# Patient Record
Sex: Female | Born: 1985 | Hispanic: Yes | Marital: Single | Smoking: Never smoker
Health system: Southern US, Community
[De-identification: ages and names within clinical notes are randomized; demographics above are authoritative.]

## PROBLEM LIST (undated history)

## (undated) ENCOUNTER — Inpatient Hospital Stay (HOSPITAL_COMMUNITY): Payer: Self-pay

## (undated) DIAGNOSIS — K831 Obstruction of bile duct: Secondary | ICD-10-CM

## (undated) DIAGNOSIS — O26649 Intrahepatic cholestasis of pregnancy, unspecified trimester: Secondary | ICD-10-CM

## (undated) DIAGNOSIS — O26619 Liver and biliary tract disorders in pregnancy, unspecified trimester: Secondary | ICD-10-CM

## (undated) HISTORY — PX: NO PAST SURGERIES: SHX2092

---

## 2011-08-14 ENCOUNTER — Emergency Department (HOSPITAL_COMMUNITY)
Admission: EM | Admit: 2011-08-14 | Discharge: 2011-08-15 | Disposition: A | Discharge status: Home / Self Care | Payer: Self-pay | Attending: Emergency Medicine | Admitting: Emergency Medicine

## 2011-08-14 DIAGNOSIS — R109 Unspecified abdominal pain: Secondary | ICD-10-CM | POA: Insufficient documentation

## 2011-08-14 DIAGNOSIS — O99891 Other specified diseases and conditions complicating pregnancy: Secondary | ICD-10-CM | POA: Insufficient documentation

## 2011-08-15 LAB — HCG, QUANTITATIVE, PREGNANCY: hCG, Beta Chain, Quant, S: 12940 m[IU]/mL — ABNORMAL HIGH (ref <5)

## 2011-08-15 LAB — CBC
MCH: 30.2 pg (ref 26.0–34.0)
MCHC: 35.9 g/dL (ref 30.0–36.0)
MCV: 84.2 fL (ref 78.0–100.0)
Platelets: 232 10*3/uL (ref 150–400)

## 2011-08-15 LAB — DIFFERENTIAL
Basophils Relative: 1 % (ref 0–1)
Eosinophils Absolute: 1.3 10*3/uL — ABNORMAL HIGH (ref 0.0–0.7)
Eosinophils Relative: 15 % — ABNORMAL HIGH (ref 0–5)
Monocytes Relative: 8 % (ref 3–12)
Neutrophils Relative %: 55 % (ref 43–77)

## 2011-08-15 NOTE — ED Notes (Signed)
See downtime charting. 

## 2011-08-16 ENCOUNTER — Other Ambulatory Visit (HOSPITAL_COMMUNITY): Payer: Self-pay | Admitting: Emergency Medicine

## 2011-08-16 ENCOUNTER — Ambulatory Visit (HOSPITAL_COMMUNITY)
Admission: RE | Admit: 2011-08-16 | Discharge: 2011-08-16 | Disposition: A | Discharge status: Home / Self Care | Payer: Self-pay | Source: Ambulatory Visit | Attending: Emergency Medicine | Admitting: Emergency Medicine

## 2011-08-16 DIAGNOSIS — R52 Pain, unspecified: Secondary | ICD-10-CM

## 2011-08-16 DIAGNOSIS — O9989 Other specified diseases and conditions complicating pregnancy, childbirth and the puerperium: Secondary | ICD-10-CM | POA: Insufficient documentation

## 2011-08-16 DIAGNOSIS — R109 Unspecified abdominal pain: Secondary | ICD-10-CM | POA: Insufficient documentation

## 2011-08-21 ENCOUNTER — Inpatient Hospital Stay (HOSPITAL_COMMUNITY)
Admission: AD | Admit: 2011-08-21 | Discharge: 2011-08-22 | Disposition: A | Discharge status: Home / Self Care | Payer: Self-pay | Source: Ambulatory Visit | Attending: Obstetrics & Gynecology | Admitting: Obstetrics & Gynecology

## 2011-08-21 ENCOUNTER — Encounter (HOSPITAL_COMMUNITY): Payer: Self-pay | Admitting: *Deleted

## 2011-08-21 DIAGNOSIS — N760 Acute vaginitis: Secondary | ICD-10-CM | POA: Insufficient documentation

## 2011-08-21 DIAGNOSIS — O2390 Unspecified genitourinary tract infection in pregnancy, unspecified trimester: Secondary | ICD-10-CM | POA: Insufficient documentation

## 2011-08-21 DIAGNOSIS — B9689 Other specified bacterial agents as the cause of diseases classified elsewhere: Secondary | ICD-10-CM | POA: Insufficient documentation

## 2011-08-21 DIAGNOSIS — O209 Hemorrhage in early pregnancy, unspecified: Secondary | ICD-10-CM | POA: Insufficient documentation

## 2011-08-21 DIAGNOSIS — A499 Bacterial infection, unspecified: Secondary | ICD-10-CM | POA: Insufficient documentation

## 2011-08-21 NOTE — MAU Provider Note (Signed)
Chief Complaint:  Vaginal Bleeding    First Provider Initiated Contact with Patient 08/21/11 2322      Beverly [name] is  26 y.o. G1P0.  No LMP recorded. Patient is pregnant.Marland Kitchen  [redacted]w[redacted]d by [redacted]w[redacted]d [place] at [facility]. She presents complaining of Vaginal Bleeding  Reports spotting intermittently since last Sunday. Visit to [facility] on 08/15/11 showed IUP at [redacted]w[redacted]d on [place]. Pt states spotting stopped then noticed again this evening around 9:15pm. Reports last intercourse ~ 48 hours ago. Denies pain.  Obstetrical/Gynecological History: OB History    Grav Para Term Preterm Abortions TAB SAB Ect Mult Living   1               Past Medical History: History reviewed. No pertinent past medical history.  Past Surgical History: History reviewed. No pertinent past surgical history.  Family History: History reviewed. No pertinent family history.  Social History: History  Substance Use Topics  . Smoking status: Never Smoker   . Smokeless tobacco: Not on file  . Alcohol Use: No    Allergies: No Known Allergies  Prescriptions prior to admission  Medication Sig Dispense Refill  . Prenatal Vit-Fe Fumarate-FA (MULTIVITAMIN-PRENATAL) 27-0.8 MG TABS Take 1 tablet by mouth daily.        Review of Systems - History obtained from chart review and the patient Respiratory ROS: no cough, shortness of breath, or wheezing Cardiovascular ROS: no chest pain or dyspnea on exertion Gastrointestinal ROS: no abdominal pain, change in bowel habits, or black or bloody stools Genito-Urinary ROS: no dysuria, trouble voiding, or hematuria positive for - vaginal spotting Musculoskeletal ROS: negative Neurological ROS: no TIA or stroke symptoms  Physical Exam   Blood pressure 107/62, pulse 64, temperature 98.5 F (36.9 C), temperature source Oral, resp. rate 18, height 4\' 11"  (1.499 m), weight 107 lb (48.535 kg), SpO2 100.00%.  General: General appearance - alert, well appearing, and in no distress, oriented to person,  place, and time and normal appearing weight Mental status - alert, oriented to person, place, and time, normal mood, behavior, speech, dress, motor activity, and thought processes, affect appropriate to mood Abdomen - soft, nontender, nondistended, no masses or organomegaly Focused Gynecological Exam: VULVA: normal appearing vulva with no masses, tenderness or lesions, VAGINA: normal appearing vagina with tan colored discharge with +whiff, no lesions, CERVIX: normal appearing cervix without discharge or lesions, UTERUS: enlarged to 6 week's size, ADNEXA: normal adnexa in size, nontender and no masses  Labs: Recent Results (from the past 24 hour(s))  WET PREP, GENITAL   Collection Time   08/21/11 11:48 PM      Component Value Range   Yeast Wet Prep HPF POC FEW (*) NONE SEEN   Trich, Wet Prep NONE SEEN  NONE SEEN   Clue Cells Wet Prep HPF POC FEW (*) NONE SEEN   WBC, Wet Prep HPF POC FEW (*) NONE SEEN   Imaging Studies:   08/21/2011: Informal bedside US: IUP with cardiac activity. CRL [redacted]w[redacted]d  US Ob Comp Less 14 Wks  08/16/2011  *RADIOLOGY REPORT*   Clinical Data: Abdominal pain  OBSTETRIC <14 WK ULTRASOUND  Technique: Transabdominal ultrasound was performed for evaluation  of the gestation as well as the maternal uterus and adnexal  regions.  Comparison: None.  Intrauterine gestational sac: Present  Yolk sac: Present  Embryo: Present  Cardiac Activity: Present  Heart Rate: 113 bpm  CRL: 2.6 mm five w six d [place] EDC: 04/10/2012  Maternal uterus/Adnexae:  Normal ovaries. No  free fluid.  IMPRESSION:  1. Single uterine gestation with embryo and normal cardiac  activity.  2. Estimate gestational age by crown-rump length equals 5 weeks 6  days.   Assessment: 1. Bleeding in early pregnancy   2. BV (bacterial vaginosis)    Plan: Discharge home Rx Flagyl Referral to [facility], preg verification letter given  Wendy Mikles E. 08/22/2011,12:11 AM

## 2011-08-22 DIAGNOSIS — O209 Hemorrhage in early pregnancy, unspecified: Secondary | ICD-10-CM

## 2011-08-22 LAB — WET PREP, GENITAL

## 2011-08-22 MED ORDER — METRONIDAZOLE 500 MG PO TABS: 500.0000 mg | ORAL_TABLET | Freq: Two times a day (BID) | ORAL | Status: AC

## 2011-08-22 NOTE — MAU Note (Signed)
Patient signed discharged papers with provider S. [place] CNM

## 2011-08-23 LAB — GC/CHLAMYDIA PROBE AMP, GENITAL: GC Probe Amp, Genital: NEGATIVE

## 2011-09-04 ENCOUNTER — Inpatient Hospital Stay (HOSPITAL_COMMUNITY)
Admission: AD | Admit: 2011-09-04 | Discharge: 2011-09-05 | Disposition: A | Discharge status: Home / Self Care | Payer: Self-pay | Source: Ambulatory Visit | Attending: Obstetrics & Gynecology | Admitting: Obstetrics & Gynecology

## 2011-09-04 ENCOUNTER — Encounter (HOSPITAL_COMMUNITY): Payer: Self-pay | Admitting: *Deleted

## 2011-09-04 DIAGNOSIS — N760 Acute vaginitis: Secondary | ICD-10-CM

## 2011-09-04 DIAGNOSIS — O2390 Unspecified genitourinary tract infection in pregnancy, unspecified trimester: Secondary | ICD-10-CM | POA: Insufficient documentation

## 2011-09-04 DIAGNOSIS — B3731 Acute candidiasis of vulva and vagina: Secondary | ICD-10-CM | POA: Insufficient documentation

## 2011-09-04 DIAGNOSIS — B373 Candidiasis of vulva and vagina: Secondary | ICD-10-CM | POA: Insufficient documentation

## 2011-09-04 DIAGNOSIS — N949 Unspecified condition associated with female genital organs and menstrual cycle: Secondary | ICD-10-CM | POA: Insufficient documentation

## 2011-09-04 DIAGNOSIS — L293 Anogenital pruritus, unspecified: Secondary | ICD-10-CM | POA: Insufficient documentation

## 2011-09-04 NOTE — MAU Provider Note (Signed)
  History     CSN: [id number]  Arrival date and time: 09/04/11 2306   First Provider Initiated Contact with Patient 09/04/11 2358      Chief Complaint  Patient presents with  . Vaginal Itching  . Vaginal Discharge   HPI This is a 26 y.o. female at [redacted]w[redacted]d who presents with c/o vaginal itching and burning. Was seen two weeks ago and treated for BV.  GC/Ch were both negative. No bleeding or other symptoms  OB History    Grav Para Term Preterm Abortions TAB SAB Ect Mult Living   1               History reviewed. No pertinent past medical history.  History reviewed. No pertinent past surgical history.  History reviewed. No pertinent family history.  History  Substance Use Topics  . Smoking status: Never Smoker   . Smokeless tobacco: Not on file  . Alcohol Use: No    Allergies: No Known Allergies  Prescriptions prior to admission  Medication Sig Dispense Refill  . Prenatal Vit-Fe Fumarate-FA (MULTIVITAMIN-PRENATAL) 27-0.8 MG TABS Take 1 tablet by mouth daily.        ROS See HPI  Physical Exam   Blood pressure 114/69, pulse 59, temperature 98 F (36.7 C), temperature source Oral, resp. rate 16, height 4\' 11"  (1.499 m), weight 108 lb (48.988 kg), SpO2 100.00%.  Physical Exam  Constitutional: She is oriented to person, place, and time. She appears well-developed and well-nourished. No distress.  Cardiovascular: Normal rate.   Respiratory: Effort normal.  GI: Soft. She exhibits no distension. There is no tenderness.  Genitourinary: Uterus normal. Vaginal discharge found.       Scant white d/c.  Erethema of vulva noted  Musculoskeletal: Normal range of motion.  Neurological: She is alert and oriented to person, place, and time.  Skin: Skin is warm and dry.  Psychiatric: She has a normal mood and affect.   Results for orders placed during the hospital encounter of 09/04/11 (from the past 24 hour(s))  WET PREP, GENITAL     Status: Abnormal   Collection Time   09/04/11  11:50 PM      Component Value Range   Yeast Wet Prep HPF POC NONE SEEN  NONE SEEN   Trich, Wet Prep NONE SEEN  NONE SEEN   Clue Cells Wet Prep HPF POC NONE SEEN  NONE SEEN   WBC, Wet Prep HPF POC FEW (*) NONE SEEN    MAU Course  Procedures  Assessment and Plan  A:  SIUP at [redacted]w[redacted]d       Vaginal yeast, even though WP is negative, I believe it looks clinically like yeast P:  Rx Terazol 7 with refill  [facility] 09/04/2011, 11:59 PM

## 2011-09-04 NOTE — MAU Note (Signed)
Pt reports she was seen here a few days ago and was told she had a vaginal infection snd she finished the meds but she is having vaginal itching, vaginal discharge and vaginal pain.

## 2011-09-05 DIAGNOSIS — N760 Acute vaginitis: Secondary | ICD-10-CM

## 2011-09-05 LAB — WET PREP, GENITAL
Clue Cells Wet Prep HPF POC: NONE SEEN
Trich, Wet Prep: NONE SEEN
Yeast Wet Prep HPF POC: NONE SEEN

## 2011-09-05 MED ORDER — TERCONAZOLE 0.4 % VA CREA: 1.0000 | TOPICAL_CREAM | Freq: Every day | VAGINAL | Status: AC

## 2011-12-12 ENCOUNTER — Other Ambulatory Visit (HOSPITAL_COMMUNITY): Payer: Self-pay | Admitting: Physician Assistant

## 2011-12-12 DIAGNOSIS — Z3689 Encounter for other specified antenatal screening: Secondary | ICD-10-CM

## 2011-12-12 LAB — OB RESULTS CONSOLE HEPATITIS B SURFACE ANTIGEN: Hepatitis B Surface Ag: NEGATIVE

## 2011-12-12 LAB — OB RESULTS CONSOLE ABO/RH
RH Type: POSITIVE
RH Type: POSITIVE

## 2011-12-12 LAB — OB RESULTS CONSOLE HIV ANTIBODY (ROUTINE TESTING): HIV: NONREACTIVE

## 2011-12-12 LAB — OB RESULTS CONSOLE VARICELLA ZOSTER ANTIBODY, IGG: Varicella: IMMUNE

## 2011-12-12 LAB — OB RESULTS CONSOLE RUBELLA ANTIBODY, IGM: Rubella: IMMUNE

## 2011-12-19 ENCOUNTER — Ambulatory Visit (HOSPITAL_COMMUNITY)
Admission: RE | Admit: 2011-12-19 | Discharge: 2011-12-19 | Disposition: A | Discharge status: Home / Self Care | Payer: Self-pay | Source: Ambulatory Visit | Attending: Physician Assistant | Admitting: Physician Assistant

## 2011-12-19 DIAGNOSIS — Z1389 Encounter for screening for other disorder: Secondary | ICD-10-CM | POA: Insufficient documentation

## 2011-12-19 DIAGNOSIS — O358XX0 Maternal care for other (suspected) fetal abnormality and damage, not applicable or unspecified: Secondary | ICD-10-CM | POA: Insufficient documentation

## 2011-12-19 DIAGNOSIS — Z3689 Encounter for other specified antenatal screening: Secondary | ICD-10-CM

## 2011-12-19 DIAGNOSIS — Z363 Encounter for antenatal screening for malformations: Secondary | ICD-10-CM | POA: Insufficient documentation

## 2012-01-25 NOTE — L&D Delivery Note (Signed)
Delivery Note At 6:51 PM a viable and healthy female was delivered via Vaginal, Spontaneous Delivery (Presentation: Left Occiput Anterior).  APGAR: 9, 9; weight .   Placenta status: Intact, Spontaneous.  Cord: 3 vessels  Anesthesia: None  Episiotomy: None Lacerations: None Est. Blood Loss (mL): 200  Mom to postpartum.  Baby to nursery-stable.  [name] 03/29/2012, 7:15 PM  At 1851 ths 27 y/o G1 now P1001 delivered a viable female infant over an intact perineum by NSVD with APGARs at 9 and 9.  Pt's pain was managed with iv pain medication.  Infant was placed on mom's chest after delivery and cord clamping was delayed till pulsation and stopped within the cord.  Cord was clamped and Dad cut the cord.  Cord blood was collected and sent.  Intact 3 vessel cord placenta were delivered spontaneiously.  Cervix and vagina were inspected.  No laceration was appreciated.  EBL 200 mL.  Pt stable to mother baby and baby to [facility].  Delivery supervised by Caren Griffins and preformed by [name], DO.    Evaluation and management procedures were performed by Resident physician under my supervision/collaboration. Chart reviewed, patient examined by me and I agree with management and plan. Present for 2nd stage, delivery, 3rd stage. Danae Orleans, CNM 03/30/2012 2:21 PM

## 2012-01-25 NOTE — L&D Delivery Note (Signed)
Attestation of Attending Supervision of Advanced Practitioner (CNM/NP): Evaluation and management procedures were performed by the Advanced Practitioner under my supervision and collaboration.  I have reviewed the Advanced Practitioner's note and chart, and I agree with the management and plan.  [name], [name] 10:53 AM     

## 2012-02-18 ENCOUNTER — Encounter (HOSPITAL_COMMUNITY): Payer: Self-pay | Admitting: Emergency Medicine

## 2012-02-18 ENCOUNTER — Emergency Department (HOSPITAL_COMMUNITY)
Admission: EM | Admit: 2012-02-18 | Discharge: 2012-02-18 | Disposition: A | Discharge status: Home / Self Care | Payer: Self-pay | Attending: Emergency Medicine | Admitting: Emergency Medicine

## 2012-02-18 ENCOUNTER — Inpatient Hospital Stay (HOSPITAL_COMMUNITY)
Admission: AD | Admit: 2012-02-18 | Discharge: 2012-02-19 | Disposition: A | Discharge status: Home / Self Care | Payer: Self-pay | Source: Ambulatory Visit | Attending: Obstetrics and Gynecology | Admitting: Obstetrics and Gynecology

## 2012-02-18 DIAGNOSIS — O99891 Other specified diseases and conditions complicating pregnancy: Secondary | ICD-10-CM | POA: Insufficient documentation

## 2012-02-18 DIAGNOSIS — K089 Disorder of teeth and supporting structures, unspecified: Secondary | ICD-10-CM | POA: Insufficient documentation

## 2012-02-18 DIAGNOSIS — O9989 Other specified diseases and conditions complicating pregnancy, childbirth and the puerperium: Secondary | ICD-10-CM | POA: Insufficient documentation

## 2012-02-18 DIAGNOSIS — K0889 Other specified disorders of teeth and supporting structures: Secondary | ICD-10-CM

## 2012-02-18 DIAGNOSIS — O368190 Decreased fetal movements, unspecified trimester, not applicable or unspecified: Secondary | ICD-10-CM | POA: Insufficient documentation

## 2012-02-18 MED ORDER — OXYCODONE-ACETAMINOPHEN 5-325 MG PO TABS: 1.0000 | ORAL_TABLET | Freq: Four times a day (QID) | ORAL | Status: DC | PRN

## 2012-02-18 MED ORDER — AMOXICILLIN 500 MG PO CAPS
500.0000 mg | ORAL_CAPSULE | Freq: Once | ORAL | Status: AC
  Administered 2012-02-18: 500 mg via ORAL
  Filled 2012-02-18: qty 1

## 2012-02-18 MED ORDER — AMOXICILLIN 500 MG PO CAPS: 500.0000 mg | ORAL_CAPSULE | Freq: Three times a day (TID) | ORAL | Status: DC

## 2012-02-18 MED ORDER — OXYCODONE-ACETAMINOPHEN 5-325 MG PO TABS
1.0000 | ORAL_TABLET | Freq: Once | ORAL | Status: AC
  Administered 2012-02-18: 1 via ORAL
  Filled 2012-02-18: qty 1

## 2012-02-18 NOTE — ED Provider Notes (Signed)
History     CSN: [id number]  Arrival date & time 02/18/12  0217   First MD Initiated Contact with Patient 02/18/12 [phone]      Chief Complaint  Patient presents with  . Dental Pain    (Consider location/radiation/quality/duration/timing/severity/associated sxs/prior treatment) HPI  Patient presents to the emergency department with a dental complaint. Symptoms began a few days ago. The patient has tried to alleviate pain with Tylenol.  Pain rated at a 10/10, characterized as throbbing in nature and located front upper tooth. Patient denies fever, night sweats, chills, difficulty swallowing or opening mouth, SOB, nuchal rigidity or decreased ROM of neck.  Patient does not have a dentist and requests a resource guide at discharge. Pt advises that she is pregnant. No concerns or complication with the baby today   History reviewed. No pertinent past medical history.  History reviewed. No pertinent past surgical history.  History reviewed. No pertinent family history.  History  Substance Use Topics  . Smoking status: Never Smoker   . Smokeless tobacco: Not on file  . Alcohol Use: No    OB History    Grav Para Term Preterm Abortions TAB SAB Ect Mult Living   1               Review of Systems  HENT: Positive for dental problem.   All other systems reviewed and are negative.    Allergies  Review of patient's allergies indicates no known allergies.  Home Medications   Current Outpatient Rx  Name  Route  Sig  Dispense  Refill  . AMOXICILLIN 500 MG PO CAPS   Oral   Take 1 capsule (500 mg total) by mouth 3 (three) times daily.   21 capsule   0   . OXYCODONE-ACETAMINOPHEN 5-325 MG PO TABS   Oral   Take 1 tablet by mouth every 6 (six) hours as needed for pain.   6 tablet   0   . PRENATAL 27-0.8 MG PO TABS   Oral   Take 1 tablet by mouth daily.           BP 110/75  Pulse 67  Temp 97.8 F (36.6 C) (Oral)  Resp 20  SpO2 100%  Physical Exam  Constitutional:  She appears well-developed and well-nourished. No distress.  HENT:  Head: Normocephalic and atraumatic.  Mouth/Throat: Uvula is midline, oropharynx is clear and moist and mucous membranes are normal. Normal dentition. Dental caries (Pts tooth shows no obvious abscess but moderate to severe tenderness to palpation of marked tooth) present. No uvula swelling.    Eyes: Pupils are equal, round, and reactive to light.  Neck: Trachea normal, normal range of motion and full passive range of motion without pain. Neck supple.  Cardiovascular: Normal rate, regular rhythm, normal heart sounds and normal pulses.   Pulmonary/Chest: Effort normal and breath sounds normal. No respiratory distress. Chest wall is not dull to percussion. She exhibits no tenderness, no crepitus, no edema, no deformity and no retraction.  Abdominal: Normal appearance.  Musculoskeletal: Normal range of motion.  Neurological: She is alert. She has normal strength.  Skin: Skin is warm, dry and intact. She is not diaphoretic.  Psychiatric: She has a normal mood and affect. Her speech is normal. Cognition and memory are normal.    ED Course  Procedures (including critical care time)  Labs Reviewed - No data to display No results found.   1. Toothache       MDM  Pt given Rx  for Percocets 5-325 (10 tabs) and Amoxicillin. Patient informed that they need to find a dentist and have the tooth worked on or the symptoms may be reoccurring. A dental referral has been given. Patient has been given return to ED precautions.  Pt has been advised of the symptoms that warrant their return to the ED. Patient has voiced understanding and has agreed to follow-up with the PCP or specialist.       Dorthula Matas, PA 02/18/12 [phone]

## 2012-02-18 NOTE — ED Provider Notes (Signed)
Medical screening examination/treatment/procedure(s) were performed by non-physician practitioner and as supervising physician I was immediately available for consultation/collaboration.  Mellony Danziger, MD 02/18/12 0554 

## 2012-02-18 NOTE — MAU Note (Signed)
Went to ED last night due to pain R side of face. Given med and sent home. Med not helping and no FM for past 5 hours.

## 2012-02-18 NOTE — ED Notes (Signed)
Patient complaining of dental pain that started two days ago; worsening pain today.  Denies facial swelling.

## 2012-02-19 ENCOUNTER — Encounter (HOSPITAL_COMMUNITY): Payer: Self-pay | Admitting: *Deleted

## 2012-02-19 DIAGNOSIS — O358XX0 Maternal care for other (suspected) fetal abnormality and damage, not applicable or unspecified: Secondary | ICD-10-CM

## 2012-02-19 DIAGNOSIS — Z1389 Encounter for screening for other disorder: Secondary | ICD-10-CM

## 2012-02-19 MED ORDER — OXYCODONE-ACETAMINOPHEN 5-325 MG PO TABS: 1.0000 | ORAL_TABLET | Freq: Four times a day (QID) | ORAL | Status: DC | PRN

## 2012-02-19 NOTE — MAU Note (Signed)
PT SAYS HER FACE ON R SIDE  STARTED HURTING  ON Thursday  SHE TOOK SOME TYLENOL- WITH SOME RELIEF.  THEN  Friday  SHE WENT TO [facility] AT 0200- THEY  GAVE HER ANTIBIOTICS AND PAIN MED.Marland Kitchen  SHE FEELS MORE SWOLLEN AND  R  SIDE OF NOSE  FEELS MORE SWOLLEN.

## 2012-02-19 NOTE — MAU Note (Signed)
USED INTERPRETER- RAQUEL

## 2012-02-19 NOTE — MAU Provider Note (Signed)
Attestation of Attending Supervision of Advanced Practitioner (CNM/NP): Evaluation and management procedures were performed by the Advanced Practitioner under my supervision and collaboration.  I have reviewed the Advanced Practitioner's note and chart, and I agree with the management and plan.  Mirah Nevins 02/19/2012 1:37 AM

## 2012-02-19 NOTE — MAU Provider Note (Signed)
History     CSN: [id number]  Arrival date and time: 02/18/12 2310   First Provider Initiated Contact with Patient 02/19/12 0049      Chief Complaint  Patient presents with  . Decreased Fetal Movement   HPI Beverly [name] is a 27 y.o. G1P0 female at [redacted]w[redacted]d who presents w/ report of Rt upper front tooth/facial pain x 2 days. Was seen at [facility] <24hrs ago and rx'd percocet prn (6pills), and amoxicillin tid x 7d.  States the percocet is not helping w/ the pain, last taken at 1700. Having difficulty w/ eating d/t pain.  No sob, dyspnea, fever, chills.  Decreased fm x 5hours, but good fm since here and on efm.  Receives pnc at [facility]  OB History    Grav Para Term Preterm Abortions TAB SAB Ect Mult Living   1               History reviewed. No pertinent past medical history.  History reviewed. No pertinent past surgical history.  History reviewed. No pertinent family history.  History  Substance Use Topics  . Smoking status: Never Smoker   . Smokeless tobacco: Not on file  . Alcohol Use: No    Allergies: No Known Allergies  Prescriptions prior to admission  Medication Sig Dispense Refill  . amoxicillin (AMOXIL) 500 MG capsule Take 1 capsule (500 mg total) by mouth 3 (three) times daily.  21 capsule  0  . [DISCONTINUED] oxyCODONE-acetaminophen (PERCOCET/ROXICET) 5-325 MG per tablet Take 1 tablet by mouth every 6 (six) hours as needed for pain.  6 tablet  0  . Prenatal Vit-Fe Fumarate-FA (MULTIVITAMIN-PRENATAL) 27-0.8 MG TABS Take 1 tablet by mouth daily.        Review of Systems  Constitutional: Negative.  Negative for fever and chills.  HENT:       Rt front tooth/facial pain and swelling  Eyes: Negative.   Respiratory: Negative.   Gastrointestinal: Negative.   Genitourinary: Negative.   Musculoskeletal: Negative.   Skin: Negative.   Neurological: Negative.   Endo/Heme/Allergies: Negative.   Psychiatric/Behavioral: Negative.    Physical Exam   Blood pressure  134/85, pulse 84, temperature 98.2 F (36.8 C), resp. rate 20, height 5\' 2"  (1.575 m), weight 55.611 kg (122 lb 9.6 oz).  Physical Exam  Constitutional: She is oriented to person, place, and time. She appears well-developed and well-nourished.  HENT:  Head: Normocephalic.  Mouth/Throat:         Points to Rt upper front tooth as source of pain.  Gums erythematous and edematous in this area.  No obvious edema of Rt jaw/cheek  Neck: Normal range of motion.  Cardiovascular: Normal rate.   Respiratory: Effort normal.  GI: Soft.  Musculoskeletal: Normal range of motion.  Neurological: She is alert and oriented to person, place, and time.  Skin: Skin is warm and dry.  Psychiatric: She has a normal mood and affect. Her behavior is normal. Judgment and thought content normal.   FHR: 135, mod variability, 15x15accels, no decels=Cat I UCs: rare mild ui, not perceived by pt.  MAU Course  Procedures  NST Physical exam  Assessment and Plan  A:  [redacted]w[redacted]d SIUP  Dental pain not relieved by current regimen  Cat I FHR, reactive NST   P:  D/C home  Rx Percocet 1-2 q 6hr prn pain #20  Continue amoxicillin as directed  Ice packs to Rt face for pain relief and decrease swelling  Call dentist first thing on Monday  am  Keep appt at [facility] as scheduled  Marge Duncans 02/19/2012, 1:01 AM

## 2012-03-14 LAB — OB RESULTS CONSOLE GBS: GBS: NEGATIVE

## 2012-03-29 ENCOUNTER — Inpatient Hospital Stay (HOSPITAL_COMMUNITY)
Admission: AD | Admit: 2012-03-29 | Discharge: 2012-03-31 | DRG: 775 | Disposition: A | Discharge status: Home / Self Care | Payer: Medicaid Other | Source: Ambulatory Visit | Attending: Obstetrics & Gynecology | Admitting: Obstetrics & Gynecology

## 2012-03-29 ENCOUNTER — Encounter (HOSPITAL_COMMUNITY): Payer: Self-pay | Admitting: Family Medicine

## 2012-03-29 LAB — CBC
HCT: 38.6 % (ref 36.0–46.0)
Hemoglobin: 14 g/dL (ref 12.0–15.0)
MCH: 30.8 pg (ref 26.0–34.0)
MCV: 85 fL (ref 78.0–100.0)
RBC: 4.54 MIL/uL (ref 3.87–5.11)
WBC: 16.2 10*3/uL — ABNORMAL HIGH (ref 4.0–10.5)

## 2012-03-29 LAB — TYPE AND SCREEN
ABO/RH(D): O POS
Antibody Screen: NEGATIVE

## 2012-03-29 MED ORDER — ONDANSETRON HCL 4 MG/2ML IJ SOLN: 4.0000 mg | Freq: Four times a day (QID) | INTRAMUSCULAR | Status: DC | PRN

## 2012-03-29 MED ORDER — DIPHENHYDRAMINE HCL 25 MG PO CAPS: 25.0000 mg | ORAL_CAPSULE | Freq: Four times a day (QID) | ORAL | Status: DC | PRN

## 2012-03-29 MED ORDER — FENTANYL CITRATE 0.05 MG/ML IJ SOLN
100.0000 ug | Freq: Once | INTRAMUSCULAR | Status: AC
  Administered 2012-03-29: 100 ug via INTRAVENOUS
  Filled 2012-03-29: qty 2

## 2012-03-29 MED ORDER — DIBUCAINE 1 % RE OINT: 1.0000 "application " | TOPICAL_OINTMENT | RECTAL | Status: DC | PRN

## 2012-03-29 MED ORDER — IBUPROFEN 600 MG PO TABS
600.0000 mg | ORAL_TABLET | Freq: Four times a day (QID) | ORAL | Status: DC | PRN
  Administered 2012-03-29: 600 mg via ORAL
  Filled 2012-03-29: qty 1

## 2012-03-29 MED ORDER — OXYCODONE-ACETAMINOPHEN 5-325 MG PO TABS
1.0000 | ORAL_TABLET | ORAL | Status: DC | PRN
  Administered 2012-03-29: 1 via ORAL
  Filled 2012-03-29: qty 1

## 2012-03-29 MED ORDER — SENNOSIDES-DOCUSATE SODIUM 8.6-50 MG PO TABS
2.0000 | ORAL_TABLET | Freq: Every day | ORAL | Status: DC
  Administered 2012-03-30: 2 via ORAL

## 2012-03-29 MED ORDER — NALBUPHINE SYRINGE 5 MG/0.5 ML
10.0000 mg | INJECTION | INTRAMUSCULAR | Status: DC | PRN
  Administered 2012-03-29: 10 mg via INTRAVENOUS
  Filled 2012-03-29 (×3): qty 1

## 2012-03-29 MED ORDER — OXYTOCIN 40 UNITS IN LACTATED RINGERS INFUSION - SIMPLE MED
62.5000 mL/h | INTRAVENOUS | Status: DC
  Filled 2012-03-29: qty 1000

## 2012-03-29 MED ORDER — PRENATAL MULTIVITAMIN CH
1.0000 | ORAL_TABLET | Freq: Every day | ORAL | Status: DC
  Administered 2012-03-30 – 2012-03-31 (×2): 1 via ORAL
  Filled 2012-03-29 (×2): qty 1

## 2012-03-29 MED ORDER — ONDANSETRON HCL 4 MG/2ML IJ SOLN: 4.0000 mg | INTRAMUSCULAR | Status: DC | PRN

## 2012-03-29 MED ORDER — TERBUTALINE SULFATE 1 MG/ML IJ SOLN: 0.2500 mg | Freq: Once | INTRAMUSCULAR | Status: DC | PRN

## 2012-03-29 MED ORDER — CITRIC ACID-SODIUM CITRATE 334-500 MG/5ML PO SOLN: 30.0000 mL | ORAL | Status: DC | PRN

## 2012-03-29 MED ORDER — SIMETHICONE 80 MG PO CHEW: 80.0000 mg | CHEWABLE_TABLET | ORAL | Status: DC | PRN

## 2012-03-29 MED ORDER — LANOLIN HYDROUS EX OINT: TOPICAL_OINTMENT | CUTANEOUS | Status: DC | PRN

## 2012-03-29 MED ORDER — ZOLPIDEM TARTRATE 5 MG PO TABS: 5.0000 mg | ORAL_TABLET | Freq: Every evening | ORAL | Status: DC | PRN

## 2012-03-29 MED ORDER — LACTATED RINGERS IV SOLN
INTRAVENOUS | Status: DC
  Administered 2012-03-29: 13:00:00 via INTRAVENOUS

## 2012-03-29 MED ORDER — ACETAMINOPHEN 325 MG PO TABS: 650.0000 mg | ORAL_TABLET | ORAL | Status: DC | PRN

## 2012-03-29 MED ORDER — TETANUS-DIPHTH-ACELL PERTUSSIS 5-2.5-18.5 LF-MCG/0.5 IM SUSP: 0.5000 mL | Freq: Once | INTRAMUSCULAR | Status: DC

## 2012-03-29 MED ORDER — IBUPROFEN 600 MG PO TABS
600.0000 mg | ORAL_TABLET | Freq: Four times a day (QID) | ORAL | Status: DC
  Administered 2012-03-30 – 2012-03-31 (×7): 600 mg via ORAL
  Filled 2012-03-29 (×6): qty 1

## 2012-03-29 MED ORDER — OXYCODONE-ACETAMINOPHEN 5-325 MG PO TABS: 1.0000 | ORAL_TABLET | ORAL | Status: DC | PRN

## 2012-03-29 MED ORDER — LIDOCAINE HCL (PF) 1 % IJ SOLN
30.0000 mL | INTRAMUSCULAR | Status: DC | PRN
  Filled 2012-03-29: qty 30

## 2012-03-29 MED ORDER — OXYTOCIN BOLUS FROM INFUSION
500.0000 mL | INTRAVENOUS | Status: DC
  Administered 2012-03-29: 500 mL via INTRAVENOUS

## 2012-03-29 MED ORDER — OXYTOCIN 40 UNITS IN LACTATED RINGERS INFUSION - SIMPLE MED
1.0000 m[IU]/min | INTRAVENOUS | Status: DC
  Administered 2012-03-29: 2 m[IU]/min via INTRAVENOUS

## 2012-03-29 MED ORDER — WITCH HAZEL-GLYCERIN EX PADS: 1.0000 "application " | MEDICATED_PAD | CUTANEOUS | Status: DC | PRN

## 2012-03-29 MED ORDER — ONDANSETRON HCL 4 MG PO TABS: 4.0000 mg | ORAL_TABLET | ORAL | Status: DC | PRN

## 2012-03-29 MED ORDER — BENZOCAINE-MENTHOL 20-0.5 % EX AERO: 1.0000 "application " | INHALATION_SPRAY | CUTANEOUS | Status: DC | PRN

## 2012-03-29 MED ORDER — LACTATED RINGERS IV SOLN: 500.0000 mL | INTRAVENOUS | Status: DC | PRN

## 2012-03-29 NOTE — MAU Provider Note (Signed)
  History     CSN: [id number]  Arrival date and time: 03/29/12 1215   None     Chief Complaint  Patient presents with  . Contractions   HPI  27 y/o G1P0 presents with contractions and SROM for evaluation of SOL.  Pertinent Gynecological History:   No past medical history on file.  No past surgical history on file.  No family history on file.  History  Substance Use Topics  . Smoking status: Never Smoker   . Smokeless tobacco: Not on file  . Alcohol Use: No    Allergies: No Known Allergies  Prescriptions prior to admission  Medication Sig Dispense Refill  . Prenatal Vit-Fe Fumarate-FA (PRENATAL MULTIVITAMIN) TABS Take 1 tablet by mouth daily at 12 noon.        ROS Physical Exam   Blood pressure 125/89, pulse 68, temperature 98.4 F (36.9 C), temperature source Oral, resp. rate 18.  Physical Exam  MAU Course  Procedures    Assessment and Plan  27 y/o G1P0 presents with contractions and SROM for evaluation of SOL -- admit to L&D for SOL -- see admission H&P for complete note.   [name] 03/29/2012, 1:12 PM  Evaluation and management procedures were performed by Resident physician under my supervision/collaboration. Chart reviewed, patient examined by me and I agree with management and plan. G1 at term with SROM, early labor

## 2012-03-29 NOTE — Progress Notes (Signed)
Beverly [name] is a 27 y.o. G1P0 at [redacted]w[redacted]d by LMP admitted for active labor, rupture of membranes.  Subjective: Pt mentions increased frequency of contractions with increased intensity.  Pt mentions continued fetal movement and pain is adequately controlled.   Objective: BP 120/76  Pulse 75  Temp(Src) 97.6 F (36.4 C) (Oral)  Resp 18  Ht 5\' 2"  (1.575 m)  Wt 55.792 kg (123 lb)  BMI 22.49 kg/m2      FHT:  FHR: 140 bpm, variability: moderate,  accelerations:  Present,  decelerations:  Absent UC:   regular, every 2-4 minutes SVE:   Dilation: 4.5 Effacement (%): 90 Station: -2 Exam by:: Beverly Fiedler, MD, [name], CNM  Labs: Lab Results  Component Value Date   WBC 16.2* 03/29/2012   HGB 14.0 03/29/2012   HCT 38.6 03/29/2012   MCV 85.0 03/29/2012   PLT 240 03/29/2012    Assessment / Plan: Spontaneous labor, progressing normally, will augment with pitocin   Labor: Progressing slowly, plan to start pitocin for augmentation.  Fetal Wellbeing:  Category I Pain Control:  Beverly [name] Anticipated MOD:  NSVD  Beverly [name] 03/29/2012, 5:01 PM  Evaluation and management procedures were performed by Resident physician under my supervision/collaboration. Chart reviewed, patient examined by me and I agree with management and plan.

## 2012-03-29 NOTE — MAU Note (Signed)
Patient is in for labor eval. She c/o painful contractions and possible rom at 0300an. She reports good fetal movement.

## 2012-03-29 NOTE — MAU Provider Note (Signed)
Attestation of Attending Supervision of Advanced Practitioner (CNM/NP): Evaluation and management procedures were performed by the Advanced Practitioner under my supervision and collaboration.  I have reviewed the Advanced Practitioner's note and chart, and I agree with the management and plan.  [name], [name] 1:48 PM     

## 2012-03-29 NOTE — H&P (Signed)
Beverly [name] is a 27 y.o. female presenting for SOL. Maternal Medical History:  Reason for admission: Rupture of membranes and contractions.  Nausea.  Contractions: Onset was 6-12 hours ago.   Frequency: regular.   Duration is approximately 1 minute.   Perceived severity is moderate.    Fetal activity: Perceived fetal activity is normal.   Last perceived fetal movement was within the past hour.    Prenatal complications: No bleeding, hypertension, pre-eclampsia, preterm labor or substance abuse.   Prenatal Complications - Diabetes: none.    # SOL: Pt mentions large gush of fluid at approximately 0300 this am with onset of contractions shortly there after.  Pt mentions 8/10 pain in her back and abdomen that is intermittent.  Pt mentions continuous fetal movement.  Pt denies blood from her vagina or fever.    OB History   Grav Para Term Preterm Abortions TAB SAB Ect Mult Living   1              No past medical history on file. No past surgical history on file. Family History: family history is not on file. Social History:  reports that she has never smoked. She does not have any smokeless tobacco history on file. She reports that she does not drink alcohol or use illicit drugs.   Prenatal Transfer Tool  Maternal Diabetes: No Genetic Screening: Declined Maternal Ultrasounds/Referrals: Normal Fetal Ultrasounds or other Referrals:  None Maternal Substance Abuse:  No Significant Maternal Medications:  None Significant Maternal Lab Results:  None Other Comments:  None  Review of Systems  Constitutional: Negative for fever and chills.  HENT: Negative for congestion.   Eyes: Negative for blurred vision and double vision.  Respiratory: Negative for cough, shortness of breath and wheezing.   Cardiovascular: Negative for chest pain, palpitations, orthopnea and leg swelling.  Gastrointestinal: Negative for heartburn, nausea, vomiting, abdominal pain and diarrhea.   Genitourinary: Negative for dysuria, frequency and hematuria.  Musculoskeletal: Negative for myalgias, back pain and joint pain.  Skin: Negative for itching and rash.  Neurological: Negative for dizziness, tingling and headaches.  Endo/Heme/Allergies: Negative for environmental allergies. Does not bruise/bleed easily.  All other systems reviewed and are negative.    Dilation: 3 Effacement (%): 90 Station: -1 Exam by:: dr Kieanna Rollo/[place], rn Blood pressure 125/89, pulse 68, temperature 98.4 F (36.9 C), temperature source Oral, resp. rate 18. Maternal Exam:  Uterine Assessment: Contraction strength is moderate.  Contraction duration is 1 minute. Contraction frequency is regular.   Introitus: Vagina is positive for vaginal discharge (clear fluid pooling with positive ferning on exam).  Ferning test: positive.  Nitrazine test: not done. Amniotic fluid character: clear.  Cervix: Cervix evaluated by digital exam.     Fetal Exam Fetal Monitor Review: Mode: ultrasound.   Baseline rate: 140.  Variability: moderate (6-25 bpm).   Pattern: accelerations present.    Fetal State Assessment: Category I - tracings are normal.     Physical Exam  Nursing note and vitals reviewed. Constitutional: She is oriented to person, place, and time. She appears well-developed and well-nourished.  HENT:  Head: Normocephalic and atraumatic.  Eyes: EOM are normal. Pupils are equal, round, and reactive to light.  Neck: Normal range of motion. Neck supple.  Cardiovascular: Normal rate, regular rhythm, normal heart sounds and intact distal pulses.  Exam reveals no gallop and no friction rub.   No murmur heard. Respiratory: Effort normal and breath sounds normal. She has no wheezes. She has no  rales.  GI: Soft. Bowel sounds are normal. There is no tenderness. There is no rebound.  Genitourinary: Uterus normal. Vaginal discharge (clear fluid pooling with positive ferning on exam) found.   Musculoskeletal: Normal range of motion. She exhibits no edema.  Neurological: She is alert and oriented to person, place, and time. She has normal reflexes.  Skin: Skin is warm and dry.  Psychiatric: She has a normal mood and affect. Her behavior is normal. Thought content normal.    Prenatal labs: ABO, Rh: O, O/Positive, Positive/-- (11/18 0000) Antibody: Negative, Negative (11/18 0000) Rubella: Immune (11/18 0000) RPR: Nonreactive (11/18 0000)  HBsAg: Negative (11/18 0000)  HIV: Non-reactive (11/18 0000)  GBS: Negative, Negative (02/19 0000)   Assessment/Plan: 27 y/o G1P0 at [redacted]w[redacted]d presenting with SjOL.  #  SOL -- admited to L&D for SOL -- continue routine care per floor protocol  DISPO: Diet: thin  Activity: up ab lib Nursing: Per floor protocol Electrolytes: replace as needed Prophylaxis: GI: none; DVT up ab lib Code: Full   Ancipitate the pt to be admitted for 2 midnight(s) for anticipated NSVD.       [name] 03/29/2012, 1:10 PM

## 2012-03-30 LAB — CBC
MCHC: 35.3 g/dL (ref 30.0–36.0)
MCV: 87.1 fL (ref 78.0–100.0)
Platelets: 236 10*3/uL (ref 150–400)
RDW: 13 % (ref 11.5–15.5)
WBC: 16.6 10*3/uL — ABNORMAL HIGH (ref 4.0–10.5)

## 2012-03-30 NOTE — Progress Notes (Signed)
UR completed 
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
## 2012-03-30 NOTE — Progress Notes (Signed)
Post Partum Day 1 Subjective: up ad lib, voiding, tolerating PO, + flatus and 3/10 lower back pain that improves with ibuprofen  Objective: Blood pressure 105/63, pulse 64, temperature 97.5 F (36.4 C), temperature source Oral, resp. rate 18, height 5\' 2"  (1.575 m), weight 55.792 kg (123 lb), SpO2 98.00%, unknown if currently breastfeeding.  Physical Exam:  General: alert, cooperative and appears stated age Lochia: appropriate Uterine Fundus: firm DVT Evaluation: No evidence of DVT seen on physical exam. No cords or calf tenderness. No significant calf/ankle edema.   Recent Labs  03/29/12 1327 03/30/12 0525  HGB 14.0 12.8  HCT 38.6 36.3    Assessment/Plan: Plan for discharge tomorrow and Discharge home -- breast feeding -- pt would like copper IUD for bc   LOS: 1 day   [name] 03/30/2012, 7:25 AM   I examined pt and agree with documentation above and resident plan of care. [employer]

## 2012-03-31 MED ORDER — PRENATAL MULTIVITAMIN CH: 1.0000 | ORAL_TABLET | Freq: Every day | ORAL | Status: DC

## 2012-03-31 MED ORDER — IBUPROFEN 600 MG PO TABS: 600.0000 mg | ORAL_TABLET | Freq: Four times a day (QID) | ORAL | Status: DC

## 2012-03-31 NOTE — Discharge Summary (Signed)
Obstetric Discharge Summary Reason for Admission: onset of labor and rupture of membranes Prenatal Procedures: none Intrapartum Procedures: spontaneous vaginal delivery Postpartum Procedures: none Complications-Operative and Postpartum: none Hemoglobin  Date Value Range Status  03/30/2012 12.8  12.0 - 15.0 g/dL Final     HCT  Date Value Range Status  03/30/2012 36.3  36.0 - 46.0 % Final    Physical Exam:  General: alert, cooperative and no distress Lochia: appropriate Uterine Fundus: firm DVT Evaluation: No evidence of DVT seen on physical exam.  Discharge Diagnoses: Term Pregnancy-delivered  Discharge Information: Date: 03/31/2012 Activity: pelvic rest Diet: routine Medications: PNV and Ibuprofen Condition: stable Instructions: refer to practice specific booklet Discharge to: home Follow-up Information   Follow up with [facility] [place]. (Make a postpartum appointment for 4-6 weeks.)    Contact information:   [address] [place] [address] [phone]      Newborn Data: Live born female  Birth Weight: 4 lb 15.9 oz (2265 g) APGAR: 9, 9  Home with mother. Breastfeeding going well; desires IUD for contraception.  Cam Hai 03/31/2012, 7:21 AM

## 2013-07-09 IMAGING — US US OB COMP LESS 14 WK
1 series · 14 of 28 positions shown · non-contrast
Comparison: None.

CLINICAL DATA: Abdominal pain

OBSTETRIC <14 WK ULTRASOUND
TECHNIQUE: Transabdominal ultrasound was performed for evaluation
of the gestation as well as the maternal uterus and adnexal
regions.

[Series 1: us ob comp less 14 wk · 0.21mm/px · 37 acquisitions, 14 frames shown]
[im 2/37]
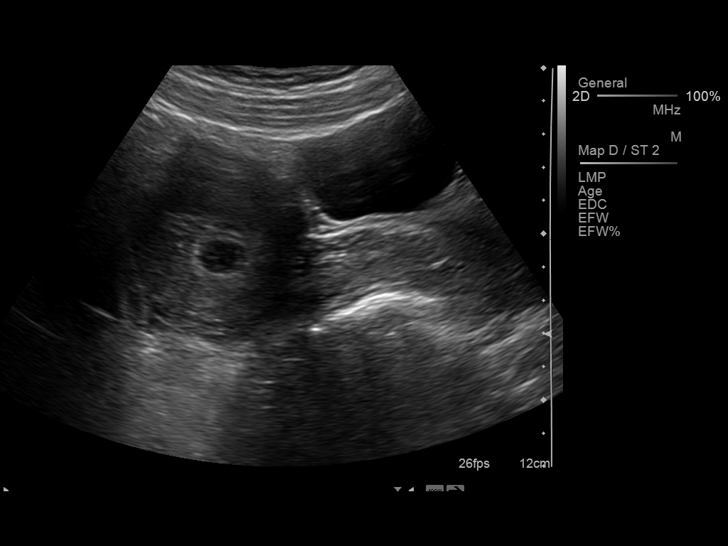
[im 5/37]
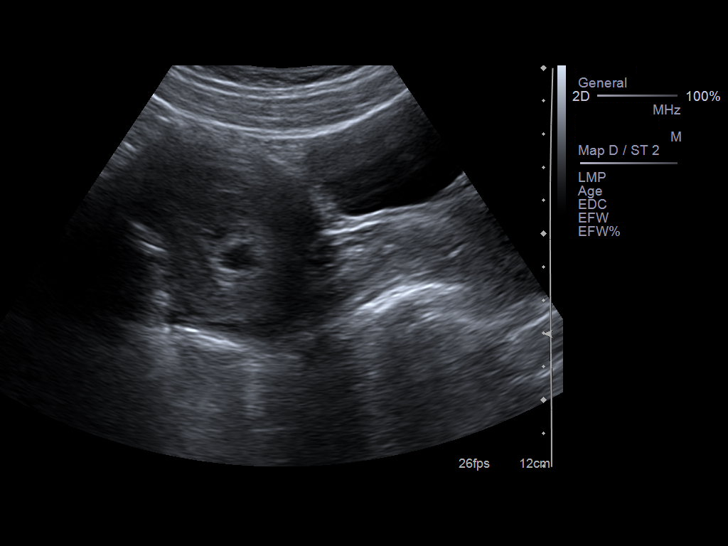
[im 7/37]
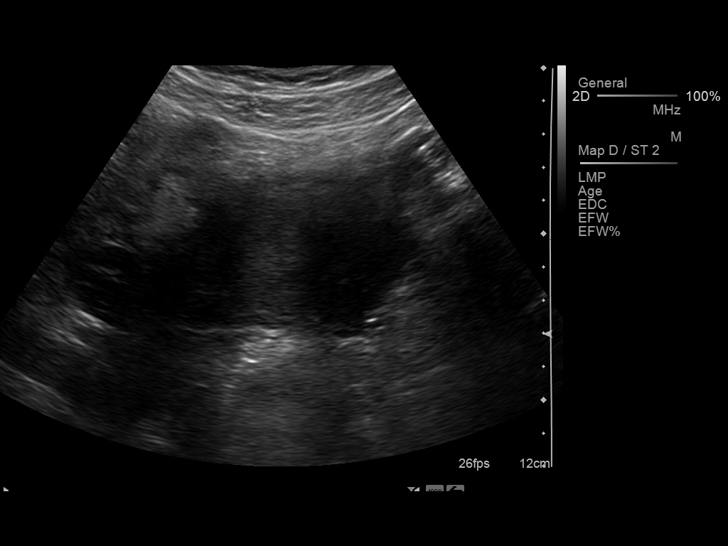
[im 10/37]
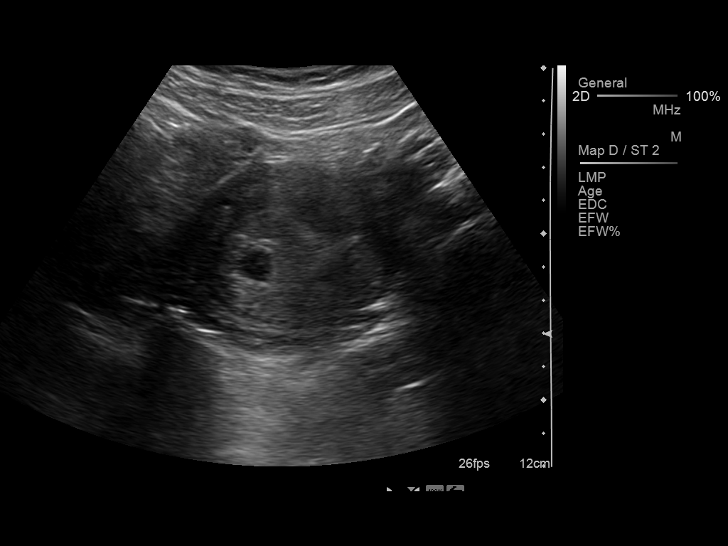
[im 13/37]
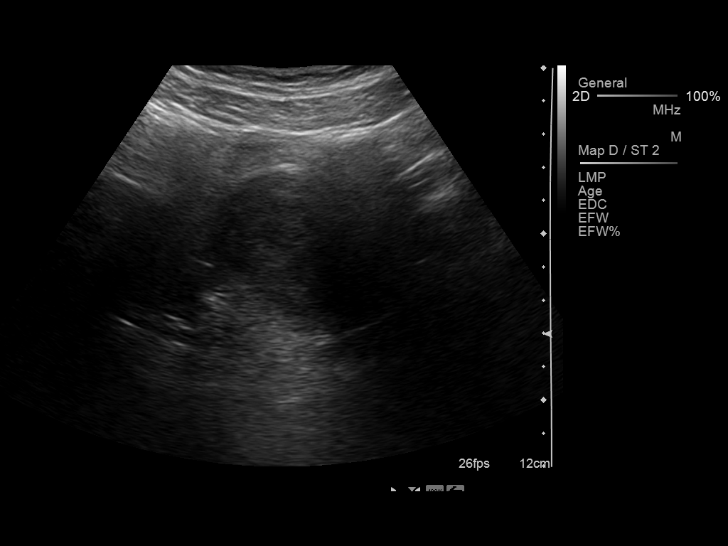
[im 15/37]
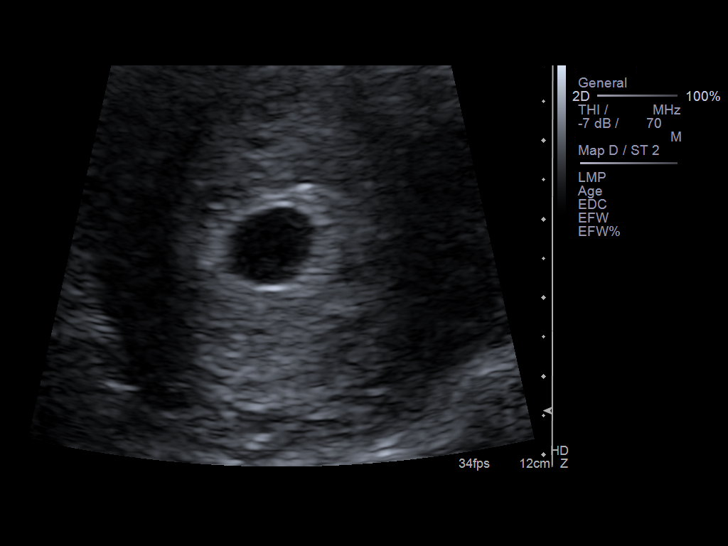
[im 18/37]
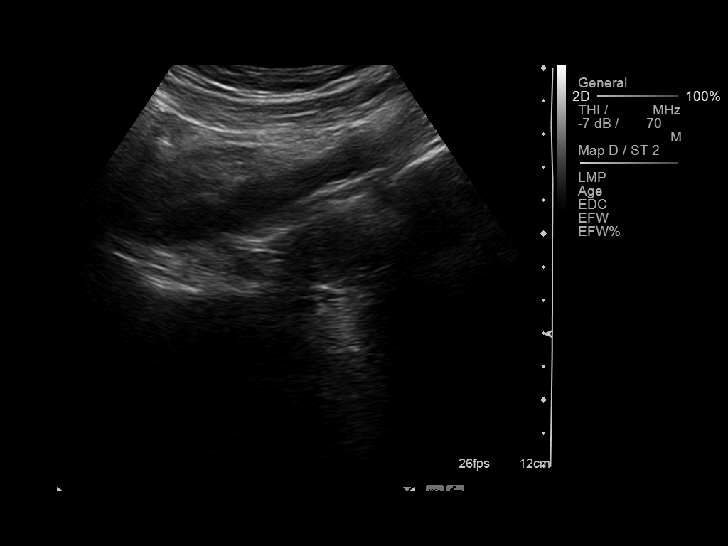
[im 21/37]
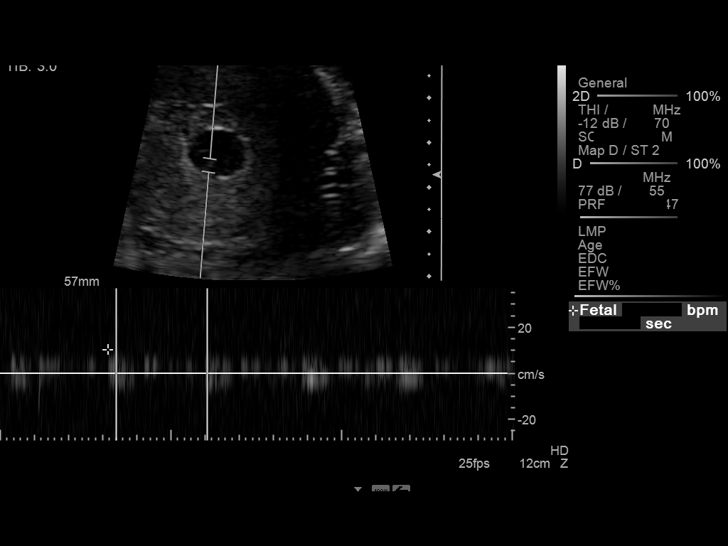
[im 23/37]
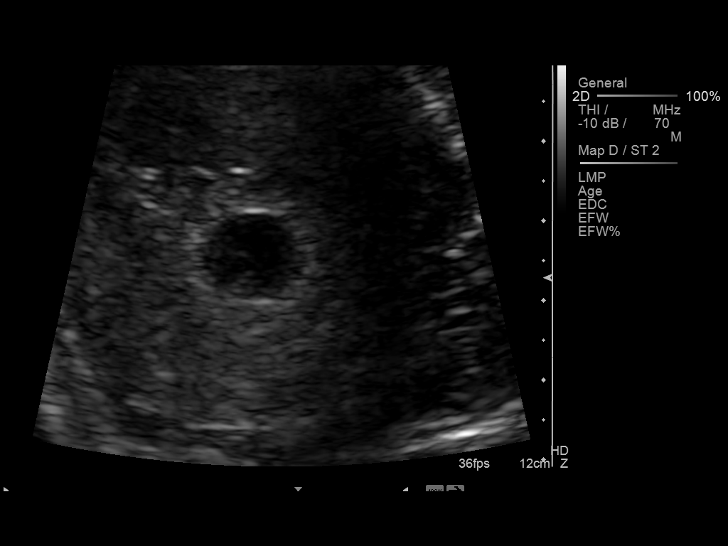
[im 26/37]
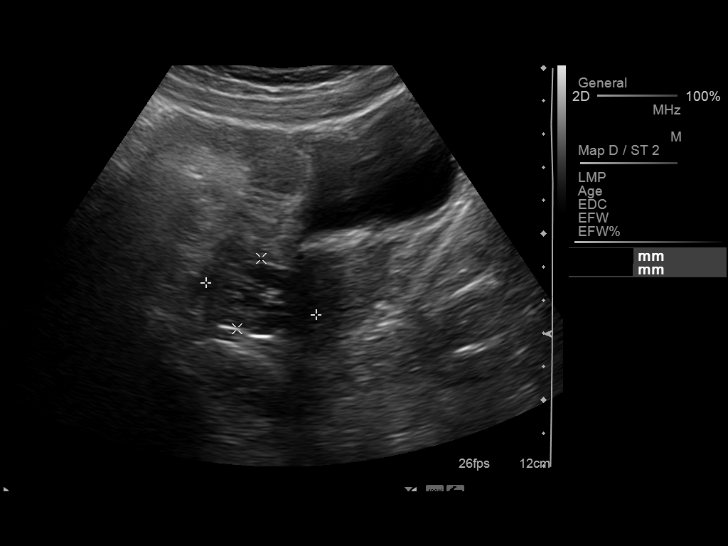
[im 29/37]
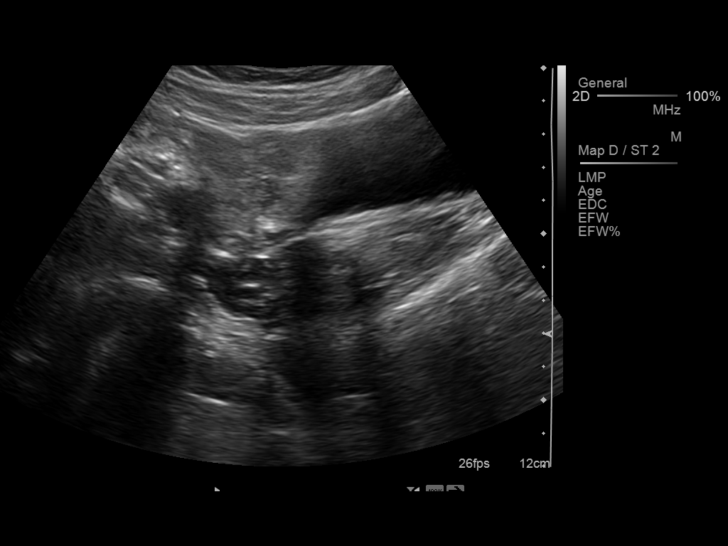
[im 31/37]
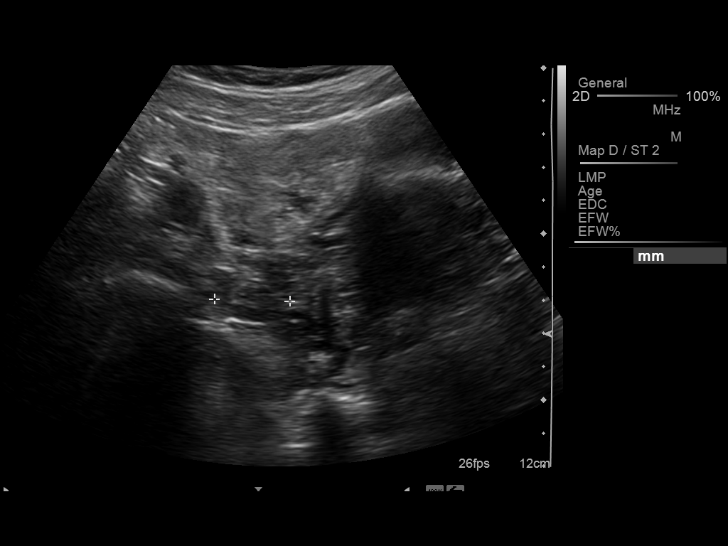
[im 34/37]
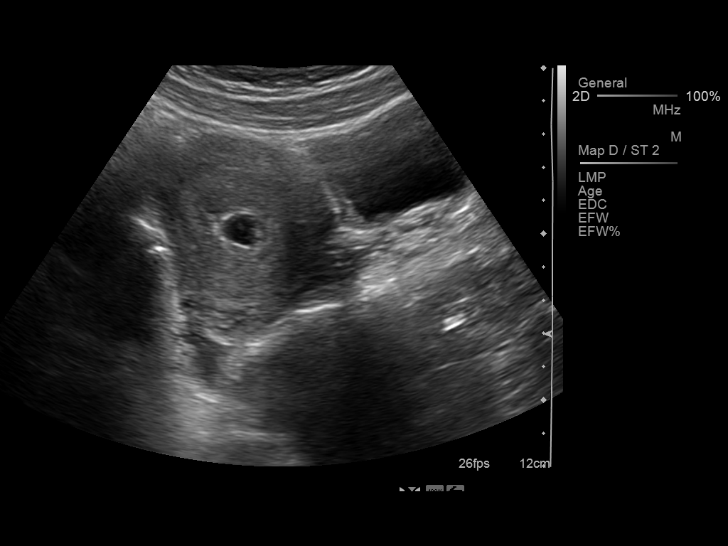
[im 37/37]
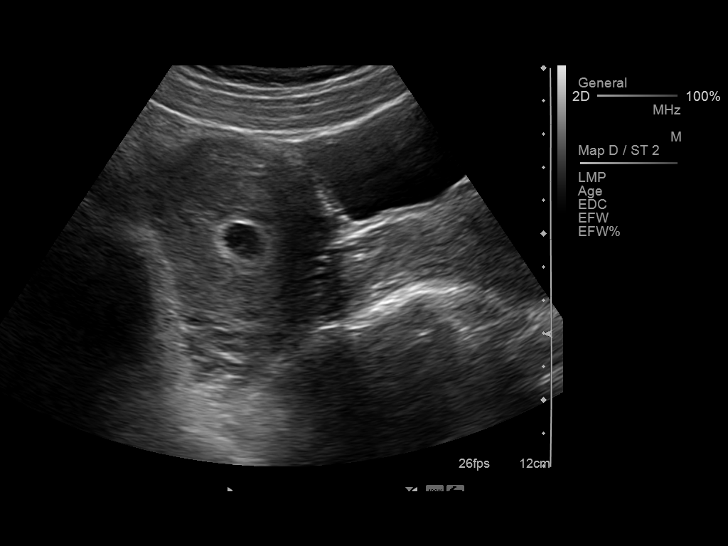

[14 of 28 positions shown; findings below may reference images not displayed]

Intrauterine gestational sac: Present
Yolk sac: Present
Embryo: Present
Cardiac Activity: Present
Heart Rate: 113 bpm

CRL:  2.6 mm  five w  six d         US EDC: 04/10/2012

Maternal uterus/Adnexae:
Normal ovaries.  No free fluid.
IMPRESSION: 1.  Single uterine gestation with embryo and normal cardiac
activity.
2.  Estimate gestational age by crown-rump length equals 5 weeks 6
days.

## 2013-11-25 ENCOUNTER — Encounter (HOSPITAL_COMMUNITY): Payer: Self-pay | Admitting: Family Medicine

## 2015-10-28 LAB — HM PAP SMEAR: HM Pap smear: NEGATIVE

## 2017-09-28 LAB — CBC AND DIFFERENTIAL
Hemoglobin: 12.5 (ref 12.0–16.0)
PLATELETS: 257 (ref 150–399)

## 2017-09-28 LAB — HM HEPATITIS C SCREENING LAB: HM Hepatitis Screen: NEGATIVE

## 2017-09-28 LAB — HIV ANTIBODY (ROUTINE TESTING W REFLEX): HIV: NONREACTIVE

## 2017-09-28 LAB — HM HIV SCREENING LAB: HM HIV SCREENING: NEGATIVE

## 2017-09-28 LAB — BASIC METABOLIC PANEL: Glucose: 72

## 2017-10-11 ENCOUNTER — Ambulatory Visit: Payer: Self-pay | Admitting: Clinical

## 2017-10-11 ENCOUNTER — Encounter: Payer: Self-pay | Admitting: Obstetrics and Gynecology

## 2017-10-11 ENCOUNTER — Ambulatory Visit (INDEPENDENT_AMBULATORY_CARE_PROVIDER_SITE_OTHER): Payer: Medicaid Other | Admitting: Obstetrics and Gynecology

## 2017-10-11 DIAGNOSIS — O0990 Supervision of high risk pregnancy, unspecified, unspecified trimester: Secondary | ICD-10-CM | POA: Insufficient documentation

## 2017-10-11 DIAGNOSIS — Z87898 Personal history of other specified conditions: Secondary | ICD-10-CM

## 2017-10-11 DIAGNOSIS — Z789 Other specified health status: Secondary | ICD-10-CM | POA: Diagnosis not present

## 2017-10-11 DIAGNOSIS — O0991 Supervision of high risk pregnancy, unspecified, first trimester: Secondary | ICD-10-CM

## 2017-10-11 NOTE — BH Specialist Note (Signed)
Integrated Behavioral Health Initial Visit  MRN: [id number] Name: Beverly [name]  Number of Integrated Behavioral Health Clinician visits:: 1/6 Session Start time: 2:22  Session End time: 2:28 Total time: 15 minutes  Type of Service: Integrated Behavioral Health- Individual/Family Interpretor:Yes.   Interpretor Name and Language: Spanish   Warm Hand Off Completed.       SUBJECTIVE: Beverly [name] is a 32 y.o. female accompanied by n/a Patient was referred by [place] Bingharlie Pickens, MD for Initial OB introduction to integrated behavioral health services . Patient reports the following symptoms/concerns: Pt states no particular concerns today Duration of problem: n/a; Severity of problem: n/a  OBJECTIVE: Mood: Normal and Affect: Appropriate Risk of harm to self or others: No plan to harm self or others  LIFE CONTEXT: Family and Social: - School/Work: - Self-Care: - Life Changes: Current pregnancy  INTERVENTIONS: Standardized Assessments completed: not given  ASSESSMENT: Patient currently experiencing Supervision of high risk pregnancy, antepartum   Patient may benefit from Initial OB introduction to integrated behavioral health services .  PLAN: 1. Follow up with behavioral health clinician on : As needed 2. Behavioral recommendations: - 3. Referral(s): [facility] 4. "From scale of 1-10, how likely are you to follow plan?": 10  Jamie C McMannes, LCSW

## 2017-10-11 NOTE — Progress Notes (Signed)
Spainish Interpreter Erika  

## 2017-10-12 NOTE — Progress Notes (Signed)
New OB Note  10/11/2017   [facility]  Chief Complaint[facility]   History of Present Illness: Ms. Roseanne KaufmanRomero Ochoa is a 32 y.o. G2P1001 @ 31/1 weeks (tentative EDC 3/3, based on Patient's last menstrual period was 06/20/2017 (exact date).).  Preg complicated by has Language barrier and Supervision of high risk pregnancy, antepartum on their problem list.   Patient doing well w/o any issues or complaints today.   ROS: A 12-point review of systems was performed and negative, except as stated in the above HPI.  OBGYN History: As per HPI. OB History  Gravida Para Term Preterm AB Living  2 1 1     1   SAB TAB Ectopic Multiple Live Births          1    # Outcome Date GA Lbr Len/2nd Weight Sex Delivery Anes PTL Lv  2 Current           1 Term 03/29/12 [redacted]w[redacted]d  4 lb 15.9 oz (2.265 kg) F Vag-Spont None  LIV     Birth Comments: none    Any issues with any prior pregnancies: no Prior children are healthy, doing well, and without any problems or issues: yes History of pap smears: Yes. Last pap smear 2017 and results were NILM   Past Medical History: No past medical history on file.  Past Surgical History: No past surgical history on file.  Family History: She denies any history of mental retardation, birth defects or genetic disorders in her or the FOB's history  Social History:  Social History   Socioeconomic History  . Marital status: Married    Spouse name: Not on file  . Number of children: Not on file  . Years of education: Not on file  . Highest education level: Not on file  Occupational History  . Not on file  Social Needs  . Financial resource strain: Not on file  . Food insecurity:    Worry: Not on file    Inability: Not on file  . Transportation needs:    Medical: Not on file    Non-medical: Not on file  Tobacco Use  . Smoking status: Never Smoker  Substance and Sexual Activity  . Alcohol use: No  . Drug  use: No  . Sexual activity: Yes  Lifestyle  . Physical activity:    Days per week: Not on file    Minutes per session: Not on file  . Stress: Not on file  Relationships  . Social connections:    Talks on phone: Not on file    Gets together: Not on file    Attends religious service: Not on file    Active member of club or organization: Not on file    Attends meetings of clubs or organizations: Not on file    Relationship status: Not on file  . Intimate partner violence:    Fear of current or ex partner: Not on file    Emotionally abused: Not on file    Physically abused: Not on file    Forced sexual activity: Not on file  Other Topics Concern  . Not on file  Social History Narrative  . Not on file    Allergy: No Known Allergies  Current Outpatient Medications: PNV  Physical Exam:   BP 107/64   Pulse 90   Wt 103 lb (46.7 kg)   LMP 06/20/2017 (Exact Date)   BMI 18.84 kg/m  Body  mass index is 18.84 kg/m. Contractions: Not present Vag. Bleeding: None. Fundal height: not applicable FHTs: 150s  General appearance: Well nourished, well developed female in no acute distress.   Laboratory: [facility] labs reviewed  Imaging:  None; pt hasn't had any u/s yet this pregnancy  Assessment: pt doing well  Plan: 1. Language barrier Interpreter used  2. Supervision of high risk pregnancy, antepartum Declines genetics. D/w recommend getting some surveillance growth u/s during pregnancy. Pt doesn't have insurance so will set up anatomy and subsequent growth u/s with [place] [facility]. Pt to be set up for anatomy u/s in 2-3wks with them  Problem list reviewed and updated.  Follow up in 3 weeks.  >50% of 20 min visit spent on counseling and coordination of care.     Cornelia Copa MD Attending Center for [facility])

## 2017-10-13 ENCOUNTER — Encounter: Payer: Self-pay | Admitting: *Deleted

## 2017-10-16 ENCOUNTER — Encounter: Payer: Self-pay | Admitting: Obstetrics and Gynecology

## 2017-10-16 DIAGNOSIS — Z87898 Personal history of other specified conditions: Secondary | ICD-10-CM | POA: Insufficient documentation

## 2017-11-01 ENCOUNTER — Telehealth: Payer: Self-pay | Admitting: *Deleted

## 2017-11-01 ENCOUNTER — Ambulatory Visit (INDEPENDENT_AMBULATORY_CARE_PROVIDER_SITE_OTHER): Payer: Self-pay | Admitting: Obstetrics and Gynecology

## 2017-11-01 VITALS — BP 99/61 | HR 74 | Wt 111.0 lb

## 2017-11-01 DIAGNOSIS — Z789 Other specified health status: Secondary | ICD-10-CM

## 2017-11-01 DIAGNOSIS — O0990 Supervision of high risk pregnancy, unspecified, unspecified trimester: Secondary | ICD-10-CM

## 2017-11-01 DIAGNOSIS — Z87898 Personal history of other specified conditions: Secondary | ICD-10-CM

## 2017-11-01 NOTE — Progress Notes (Addendum)
Prenatal Visit Note Date: 11/01/2017 Clinic[facility]  Subjective:  Beverly [name] is a 32 y.o. G2P1001 at [redacted]w[redacted]d being seen today for ongoing prenatal care.  She is currently monitored for the following issues for this high-risk pregnancy and has Language barrier; Supervision of high risk pregnancy, antepartum; and History of low birth weight on their problem list.  Patient reports no complaints.   Contractions: Not present. Vag. Bleeding: None.  Movement: Present. Denies leaking of fluid.   The following portions of the patient's history were reviewed and updated as appropriate: allergies, current medications, past family history, past medical history, past social history, past surgical history and problem list. Problem list updated.  Objective:   Vitals:   11/01/17 0819  BP: 99/61  Pulse: 74  Weight: 111 lb (50.3 kg)    Fetal Status: Fetal Heart Rate (bpm): 143   Movement: Present     General:  Alert, oriented and cooperative. Patient is in no acute distress.  Skin: Skin is warm and dry. No rash noted.   Cardiovascular: Normal heart rate noted  Respiratory: Normal respiratory effort, no problems with respiration noted  Abdomen: Soft, gravid, appropriate for gestational age. Pain/Pressure: Absent     Pelvic:  Cervical exam deferred        Extremities: Normal range of motion.  Edema: None  Mental Status: Normal mood and affect. Normal behavior. Normal judgment and thought content.   Urinalysis:      Assessment and Plan:  Pregnancy: G2P1001 at [redacted]w[redacted]d  1. Supervision of high risk pregnancy, antepartum Routine care. Declines genetics. Pt had u/s on 9/30 at [facility]. Will call for results. Finalize edc then  2. Language barrier Interpreter used  3. History of low birth weight D/w her that if [facility] are normal then recommend getting an u/s around 28 and 35wks  Preterm labor symptoms and general obstetric precautions including but not limited to vaginal  bleeding, contractions, leaking of fluid and fetal movement were reviewed in detail with the patient. Please refer to After Visit Summary for other counseling recommendations.  Return in about 4 weeks (around 11/29/2017) for 3-4wk hrob.   Globe Bing, MD   ADDENDUM U/s results received and u/s corresponsed to 16/3 weeks with EDC or 3/13 is not more than 10 days difference so will keep EDC at 3/3 and they only did a dating u/s. Placenta normal. Will have them do an anatomy u/s in a few weeks  Cornelia Copa MD Attending Center for [employer] (Faculty Practice) 11/01/2017 Time: [phone]

## 2017-11-01 NOTE — Telephone Encounter (Signed)
Interpreter ID # [id number] used to call pt to inform her of her ultrasound appointment at the [facility] Department located at the [address] on the 1st floor on 11/06/17 @ 1230.  Pt instructed to bring $160 to the appointment.  Pt verbalized understanding.

## 2017-11-02 ENCOUNTER — Encounter: Payer: Self-pay | Admitting: *Deleted

## 2017-11-07 ENCOUNTER — Encounter: Payer: Self-pay | Admitting: *Deleted

## 2017-11-10 ENCOUNTER — Encounter: Payer: Self-pay | Admitting: *Deleted

## 2017-11-22 ENCOUNTER — Ambulatory Visit (INDEPENDENT_AMBULATORY_CARE_PROVIDER_SITE_OTHER): Payer: Self-pay | Admitting: Obstetrics and Gynecology

## 2017-11-22 ENCOUNTER — Encounter: Payer: Self-pay | Admitting: Obstetrics and Gynecology

## 2017-11-22 VITALS — BP 91/54 | HR 75 | Wt 114.2 lb

## 2017-11-22 DIAGNOSIS — O365990 Maternal care for other known or suspected poor fetal growth, unspecified trimester, not applicable or unspecified: Secondary | ICD-10-CM | POA: Insufficient documentation

## 2017-11-22 DIAGNOSIS — O365910 Maternal care for other known or suspected poor fetal growth, first trimester, not applicable or unspecified: Secondary | ICD-10-CM

## 2017-11-22 DIAGNOSIS — O0990 Supervision of high risk pregnancy, unspecified, unspecified trimester: Secondary | ICD-10-CM

## 2017-11-22 DIAGNOSIS — Z87898 Personal history of other specified conditions: Secondary | ICD-10-CM

## 2017-11-22 DIAGNOSIS — Z789 Other specified health status: Secondary | ICD-10-CM

## 2017-11-22 MED ORDER — PRENATAL MULTIVITAMIN CH: 1.0000 | ORAL_TABLET | Freq: Every day | ORAL | 11 refills | Status: AC

## 2017-11-22 NOTE — Progress Notes (Signed)
   PRENATAL VISIT NOTE  Subjective:  Beverly [name] is a 32 y.o. G2P1001 at [redacted]w[redacted]d being seen today for ongoing prenatal care.  She is currently monitored for the following issues for this low-risk pregnancy and has Language barrier; Supervision of high risk pregnancy, antepartum; History of low birth weight; and IUGR (intrauterine growth restriction) affecting care of mother on their problem list.  Patient reports no complaints.  Contractions: Not present. Vag. Bleeding: None.  Movement: Present. Denies leaking of fluid.   The following portions of the patient's history were reviewed and updated as appropriate: allergies, current medications, past family history, past medical history, past social history, past surgical history and problem list. Problem list updated.  Objective:   Vitals:   11/22/17 0827  BP: (!) 91/54  Pulse: 75  Weight: 114 lb 3.2 oz (51.8 kg)    Fetal Status: Fetal Heart Rate (bpm): 148   Movement: Present     General:  Alert, oriented and cooperative. Patient is in no acute distress.  Skin: Skin is warm and dry. No rash noted.   Cardiovascular: Normal heart rate noted  Respiratory: Normal respiratory effort, no problems with respiration noted  Abdomen: Soft, gravid, appropriate for gestational age.  Pain/Pressure: Absent     Pelvic: Cervical exam deferred        Extremities: Normal range of motion.  Edema: None  Mental Status: Normal mood and affect. Normal behavior. Normal judgment and thought content.   Assessment and Plan:  Pregnancy: G2P1001 at [redacted]w[redacted]d  1. Supervision of high risk pregnancy, antepartum - Prenatal Vit-Fe Fumarate-FA (PRENATAL MULTIVITAMIN) TABS tablet; Take 1 tablet by mouth daily at 12 noon. (Patient not taking: Reported on 11/22/2017)  Dispense: 30 tablet; Refill: 11  2. History of low birth weight  3. Intrauterine growth restriction (IUGR) affecting care of mother, first trimester, single or unspecified fetus Reviewed [place] from  [facility], EFW 3%tile @ 17 weeks, 4th%tile at 19 weeks Reviewed with patient the need for closer monitoring, will repeat anatomy @ MFM, need for weekly BPP if EFW < 10th%tile - [place] MFM OB DETAIL +14 WK; Future  4. Language barrier Engineer, structural used   Preterm labor symptoms and general obstetric precautions including but not limited to vaginal bleeding, contractions, leaking of fluid and fetal movement were reviewed in detail with the patient. Please refer to After Visit Summary for other counseling recommendations.  Return in about 2 weeks (around 12/06/2017) for 2 hr GTT, 3rd trim labs, OB visit.  Future Appointments  Date Time Provider Department Center  12/06/2017  8:45 AM WH-MFC [place] 5 WH-MFCUS MFC-US  12/08/2017  8:15 AM Reva Bores, MD [facility]  12/08/2017  8:50 AM [facility] LAB [facility] WOC    Conan Bowens, MD

## 2017-11-22 NOTE — Progress Notes (Signed)
Declines flu shot

## 2017-11-22 NOTE — Patient Instructions (Signed)
Etonogestrel implant Qu es este medicamento? El ETONOGESTREL es un dispositivo anticonceptivo (control de la natalidad). Se usa para evitar los embarazos. Se puede usar hasta por 3 aos. Este medicamento puede ser utilizado para otros usos; si tiene alguna pregunta consulte con su proveedor de atencin mdica o con su farmacutico. MARCAS COMUNES: Implanon, Nexplanon Qu le debo informar a mi profesional de la salud antes de tomar este medicamento? Necesita saber si usted presenta alguno de los siguientes problemas o situaciones: sangrado vaginal anormal enfermedad vascular o cogulos sanguneos cncer de mama, cervical, heptico depresin diabetes enfermedad de la vescula biliar dolores de cabeza enfermedad cardiaca o ataque cardiaco reciente alta presin sangunea alto nivel de colesterol enfermedad renal enfermedad heptica convulsiones fuma tabaco una reaccin alrgica o inusual al etonogestrel, otras hormonas, anestsicos o antispticos, medicamentos, alimentos, colorantes o conservantes si est embarazada o buscando quedar embarazada si est amamantando a un beb Cmo debo utilizar este medicamento? Un profesional de la salud inserta este dispositivo justo debajo de la piel en la parte interior del brazo. Hable con su pediatra para informarse acerca del uso de este medicamento en nios. Puede requerir atencin especial. Sobredosis: Pngase en contacto inmediatamente con un centro toxicolgico o una sala de urgencia si usted cree que haya tomado demasiado medicamento. ATENCIN: Este medicamento es solo para usted. No comparta este medicamento con nadie. Qu sucede si me olvido de una dosis? No se aplica en este caso. Qu puede interactuar con este medicamento? No tome este medicamento con ninguno de los siguientes frmacos: amprenavir bosentano fosamprenavir Este medicamento tambin podra interactuar con los siguientes medicamentos: barbitricos para inducir el sueo o para el  tratamiento de convulsiones ciertos medicamentos para infecciones micticas, tales como itraconazol y ketoconazol jugo de toronja griseofulvina medicamentos para tratar convulsiones, tales como carbamazepina, felbamato, oxcarbazepina, fenitona, topiramato modafinilo fenilbutazona rifampicina rufinamida algunos medicamentos para tratar la infeccin por el VIH, tales como atazanavir, indinavir, lopinavir, nelfinavir, tipranavir, ritonavir hierba de [place] Puede ser que esta lista no menciona todas las posibles interacciones. Informe a su profesional de la salud de todos los productos a base de hierbas, medicamentos de venta libre o suplementos nutritivos que est tomando. Si usted fuma, consume bebidas alcohlicas o si utiliza drogas ilegales, indqueselo tambin a su profesional de la salud. Algunas sustancias pueden interactuar con su medicamento. A qu debo estar atento al usar este medicamento? Este producto no protege contra la infeccin por el VIH (SIDA) u otras enfermedades de transmisin sexual. Usted debe sentir el implante al presionar con las yemas de los dedos sobre la piel donde se insert. Contacte a su mdico si no se siente el implante y usa un mtodo anticonceptivo no hormonal (como el condn) hasta que el mdico confirma que el implante est en su lugar. Si siente que el implante puede haber roto o doblado en su brazo, pngase en contacto con su proveedor de atencin mdica. Qu efectos secundarios puedo tener al utilizar este medicamento? Efectos secundarios que debe informar a su mdico o a su profesional de la salud tan pronto como sea posible: reacciones alrgicas, como erupcin cutnea, picazn o urticarias, e hinchazn de la cara, los labios o la lengua bultos en las mamas cambios en las emociones o el estado de nimo estado de nimo deprimido sangrado menstrual intenso o prolongado dolor, irritacin, hinchazn o moretones en el lugar de la insercin cicatriz en el lugar de la  insercin signos de infeccin en el sitio de insercin tales como fiebre, y enrojecimiento de   la piel, dolor o secrecin signos de Psychiatrist signos y sntomas de un cogulo sanguneo, tales como problemas respiratorios; cambios en la visin; dolor en el pecho; dolor de cabeza severo, repentino; dolor, hinchazn, calor en la pierna; dificultad para hablar; entumecimiento o debilidad repentina de la cara, el brazo o la pierna signos y sntomas de lesin al hgado, como orina amarilla oscura o [place]; sensacin general de estar enfermo o sntomas gripales; heces claras; prdida de apetito; nuseas; dolor en la regin abdominal superior derecha; cansancio o debilidad inusual; color amarillento de los ojos o la piel sangrado vaginal inusual, secrecin signos y sntomas de un derrame cerebral, tales como cambios en la visin; confusin; dificultad para hablar o entender; dolores de cabeza severos; entumecimiento o debilidad repentina de la cara, el brazo o la pierna; problemas al Advertising account planner; Psychiatrist; prdida del equilibrio o la coordinacin Efectos secundarios que generalmente no requieren Psychologist, prison and probation services (infrmelos a su mdico o a Producer, television/film/video de la salud si persisten o si son molestos): acn dolor de Retail buyer en las mamas cambios de peso mareos sensacin general de estar enfermo o sntomas gripales dolor de cabeza sangrado menstrual irregular nuseas dolor de garganta irritacin o inflamacin vaginal Puede ser que esta lista no menciona todos los posibles efectos secundarios. Comunquese a su mdico por asesoramiento mdico [employer]. Usted puede informar los efectos secundarios a la FDA por telfono al 1-800-FDA-1088. Dnde debo guardar mi medicina? Este medicamento se administra en hospitales o clnicas y no necesitar guardarlo en su domicilio. ATENCIN: Este folleto es un resumen. Puede ser que no cubra toda la posible informacin. Si usted tiene preguntas acerca de esta medicina,  consulte con su mdico, su farmacutico o su profesional de Radiographer, therapeutic.  2018 Elsevier/Gold Standard (2016-02-11 00:00:00) Informacin sobre el dispositivo intrauterino (Intrauterine Device Information) Un dispositivo intrauterino (DIU) se inserta en el tero e impide el embarazo. Hay dos tipos de DIU:  DIU de cobre: este tipo de DIU est recubierto con un alambre de cobre y se inserta dentro del tero. El cobre hace que el tero y las trompas de [place] produzcan un liquido que [employer] espermatozoides. El DIU de cobre puede Geneticist, molecular durante 10 aos.  DIU con hormona: este tipo de DIU contiene la hormona progestina (progesterona sinttica). Las hormonas hacen que el moco cervical se haga ms espeso, lo que evita que el esperma ingrese al tero. Tambin hace que la membrana que recubre internamente al tero sea ms delgada lo que impide el implante del vulo fertilizado. La hormona debilita o destruye los espermatozoides que ingresan al tero. Alguno de los tipos de DIU hormonal pueden Geneticist, molecular durante 5 aos y otros tipos pueden dejarse en el lugar por 3 aos. El mdico se asegurar de que usted sea una buena candidata para usar el DIU. Converse con su mdico acerca de los posibles efectos secundarios. VENTAJASDEL DISPOSITIVO INTRAUTERINO  El DIU es muy eficaz, reversible, de accin prolongada y de bajo mantenimiento.  No hay efectos secundarios relacionados con el estrgeno.  El DIU puede ser utilizado durante la Market researcher.  No est asociado con el aumento de [place].  Funciona inmediatamente despus de la insercin.  El DIU hormonal funciona inmediatamente si se inserta dentro de los [address] del inicio del perodo. Ser necesario que utilice un mtodo anticonceptivo adicional durante 7 das si el DIU hormonal se inserta en algn otro momento del ciclo.  El DIU de cobre no interfiere con las  hormonas femeninas.  El DIU hormonal puede hacer que los perodos  menstruales abundantes se hagan ms ligeros y que haya menos clicos.  El DIU hormonal puede usarse durante 3 a 5 aos.  El DIU de cobre puede usarse durante 10 aos.  DESVENTAJASDEL DISPOSITIVO Tesoro CorporationNTRAUTERINO  El DIU hormonal puede estar asociado con patrones de sangrado irregular.  El DIU de cobre puede hacer que el flujo menstrual ms abundante y doloroso.  Puede experimentar clicos y sangrado vaginal despus de la insercin.  Esta informacin no tiene Theme park managercomo fin reemplazar el consejo del mdico. Asegrese de hacerle al mdico cualquier pregunta que tenga. Document Released: 06/30/2009 Document Revised: 05/04/2015 Document Reviewed: 07/01/2012 Elsevier Interactive Patient Education  2017 [employer]

## 2017-11-29 ENCOUNTER — Encounter (HOSPITAL_COMMUNITY): Payer: Self-pay

## 2017-12-06 ENCOUNTER — Encounter (HOSPITAL_COMMUNITY): Payer: Self-pay

## 2017-12-06 ENCOUNTER — Other Ambulatory Visit (HOSPITAL_COMMUNITY): Payer: Self-pay | Admitting: *Deleted

## 2017-12-06 ENCOUNTER — Ambulatory Visit (HOSPITAL_COMMUNITY)
Admission: RE | Admit: 2017-12-06 | Discharge: 2017-12-06 | Disposition: A | Discharge status: Home / Self Care | Payer: Self-pay | Source: Ambulatory Visit | Attending: Obstetrics and Gynecology | Admitting: Obstetrics and Gynecology

## 2017-12-06 VITALS — BP 106/61 | HR 79 | Wt 117.8 lb

## 2017-12-06 DIAGNOSIS — Z87898 Personal history of other specified conditions: Secondary | ICD-10-CM

## 2017-12-06 DIAGNOSIS — O09292 Supervision of pregnancy with other poor reproductive or obstetric history, second trimester: Secondary | ICD-10-CM

## 2017-12-06 DIAGNOSIS — O0990 Supervision of high risk pregnancy, unspecified, unspecified trimester: Secondary | ICD-10-CM

## 2017-12-06 DIAGNOSIS — Z363 Encounter for antenatal screening for malformations: Secondary | ICD-10-CM | POA: Insufficient documentation

## 2017-12-06 DIAGNOSIS — O365922 Maternal care for other known or suspected poor fetal growth, second trimester, fetus 2: Secondary | ICD-10-CM | POA: Insufficient documentation

## 2017-12-06 DIAGNOSIS — Z3A22 22 weeks gestation of pregnancy: Secondary | ICD-10-CM | POA: Insufficient documentation

## 2017-12-06 DIAGNOSIS — O365920 Maternal care for other known or suspected poor fetal growth, second trimester, not applicable or unspecified: Secondary | ICD-10-CM

## 2017-12-07 ENCOUNTER — Other Ambulatory Visit: Payer: Self-pay | Admitting: *Deleted

## 2017-12-07 DIAGNOSIS — O0990 Supervision of high risk pregnancy, unspecified, unspecified trimester: Secondary | ICD-10-CM

## 2017-12-07 DIAGNOSIS — O365910 Maternal care for other known or suspected poor fetal growth, first trimester, not applicable or unspecified: Secondary | ICD-10-CM

## 2017-12-08 ENCOUNTER — Other Ambulatory Visit: Payer: Self-pay

## 2017-12-08 ENCOUNTER — Ambulatory Visit (INDEPENDENT_AMBULATORY_CARE_PROVIDER_SITE_OTHER): Payer: Self-pay | Admitting: Student

## 2017-12-08 VITALS — BP 92/57 | HR 68 | Wt 115.6 lb

## 2017-12-08 DIAGNOSIS — O0990 Supervision of high risk pregnancy, unspecified, unspecified trimester: Secondary | ICD-10-CM

## 2017-12-08 DIAGNOSIS — Z789 Other specified health status: Secondary | ICD-10-CM

## 2017-12-08 DIAGNOSIS — O0992 Supervision of high risk pregnancy, unspecified, second trimester: Secondary | ICD-10-CM

## 2017-12-08 DIAGNOSIS — O365920 Maternal care for other known or suspected poor fetal growth, second trimester, not applicable or unspecified: Secondary | ICD-10-CM

## 2017-12-08 NOTE — Patient Instructions (Signed)

## 2017-12-08 NOTE — Progress Notes (Signed)
   PRENATAL VISIT NOTE  Subjective:  Beverly [name] is a 32 y.o. G2P1001 at 2646w0d being seen today for ongoing prenatal care.  She is currently monitored for the following issues for this high-risk pregnancy and has Language barrier; Supervision of high risk pregnancy, antepartum; History of low birth weight; and IUGR (intrauterine growth restriction) affecting care of mother on their problem list.  Patient reports no complaints.  Contractions: Not present. Vag. Bleeding: None.  Movement: Present. Denies leaking of fluid.   The following portions of the patient's history were reviewed and updated as appropriate: allergies, current medications, past family history, past medical history, past social history, past surgical history and problem list. Problem list updated.  Objective:   Vitals:   12/08/17 0822  BP: (!) 92/57  Pulse: 68  Weight: 115 lb 9.6 oz (52.4 kg)    Fetal Status: Fetal Heart Rate (bpm): 150 Fundal Height: 23 cm Movement: Present     General:  Alert, oriented and cooperative. Patient is in no acute distress.  Skin: Skin is warm and dry. No rash noted.   Cardiovascular: Normal heart rate noted  Respiratory: Normal respiratory effort, no problems with respiration noted  Abdomen: Soft, gravid, appropriate for gestational age.  Pain/Pressure: Absent     Pelvic: Cervical exam deferred        Extremities: Normal range of motion.  Edema: None  Mental Status: Normal mood and affect. Normal behavior. Normal judgment and thought content.   Assessment and Plan:  Pregnancy: G2P1001 at 1146w0d  1. Supervision of high risk pregnancy, antepartum -doing well -discussed 28 wk labs at next visit. Was fasting today for labs but EDD was updated & is too early for labs  2. Language barrier -Spanish interpreter at bedside  3. Intrauterine growth restriction (IUGR) affecting care of mother, second trimester, single or unspecified fetus -4th% per [facility] scan. Per MFM, EDD  should be updated to 3/13 based on their scan & after reviewing previous scans at [facility]. Has f/u ultrasound scheduled next month to monitor growth.   Preterm labor symptoms and general obstetric precautions including but not limited to vaginal bleeding, contractions, leaking of fluid and fetal movement were reviewed in detail with the patient. Please refer to After Visit Summary for other counseling recommendations.  Return in about 4 weeks (around 01/05/2018) for High Risk OB & fasting 28 wk labs.  Future Appointments  Date Time Provider Department Center  12/08/2017  8:50 AM [facility] LAB [facility]OC  01/03/2018  8:30 AM [facility] US 1 [facility]    Judeth HornErin Shaton Lore, NP

## 2018-01-03 ENCOUNTER — Encounter (HOSPITAL_COMMUNITY): Payer: Self-pay

## 2018-01-03 ENCOUNTER — Other Ambulatory Visit (HOSPITAL_COMMUNITY): Payer: Self-pay | Admitting: *Deleted

## 2018-01-03 ENCOUNTER — Ambulatory Visit (HOSPITAL_COMMUNITY)
Admission: RE | Admit: 2018-01-03 | Discharge: 2018-01-03 | Disposition: A | Discharge status: Home / Self Care | Payer: Self-pay | Source: Ambulatory Visit | Attending: Obstetrics and Gynecology | Admitting: Obstetrics and Gynecology

## 2018-01-03 DIAGNOSIS — O09299 Supervision of pregnancy with other poor reproductive or obstetric history, unspecified trimester: Secondary | ICD-10-CM

## 2018-01-03 DIAGNOSIS — O09292 Supervision of pregnancy with other poor reproductive or obstetric history, second trimester: Secondary | ICD-10-CM | POA: Insufficient documentation

## 2018-01-03 DIAGNOSIS — Z362 Encounter for other antenatal screening follow-up: Secondary | ICD-10-CM | POA: Insufficient documentation

## 2018-01-03 DIAGNOSIS — Z3A26 26 weeks gestation of pregnancy: Secondary | ICD-10-CM | POA: Insufficient documentation

## 2018-01-05 ENCOUNTER — Ambulatory Visit (INDEPENDENT_AMBULATORY_CARE_PROVIDER_SITE_OTHER): Payer: Self-pay | Admitting: Student

## 2018-01-05 ENCOUNTER — Other Ambulatory Visit: Payer: Self-pay

## 2018-01-05 VITALS — BP 95/48 | HR 62 | Wt 115.0 lb

## 2018-01-05 DIAGNOSIS — O0990 Supervision of high risk pregnancy, unspecified, unspecified trimester: Secondary | ICD-10-CM

## 2018-01-05 DIAGNOSIS — Z23 Encounter for immunization: Secondary | ICD-10-CM

## 2018-01-05 DIAGNOSIS — Z789 Other specified health status: Secondary | ICD-10-CM

## 2018-01-05 DIAGNOSIS — O0992 Supervision of high risk pregnancy, unspecified, second trimester: Secondary | ICD-10-CM

## 2018-01-05 DIAGNOSIS — O365910 Maternal care for other known or suspected poor fetal growth, first trimester, not applicable or unspecified: Secondary | ICD-10-CM

## 2018-01-05 NOTE — Progress Notes (Signed)
   PRENATAL VISIT NOTE  Subjective:  Beverly [name] is a 32 y.o. G2P1001 at 156w0d being seen today for ongoing prenatal care.  She is currently monitored for the following issues for this high-risk pregnancy and has Language barrier; Supervision of high risk pregnancy, antepartum; History of low birth weight; and IUGR (intrauterine growth restriction) affecting care of mother on their problem list.  Patient reports no complaints.  Contractions: Not present. Vag. Bleeding: None.  Movement: Present. Denies leaking of fluid.   The following portions of the patient's history were reviewed and updated as appropriate: allergies, current medications, past family history, past medical history, past social history, past surgical history and problem list. Problem list updated.  Objective:   Vitals:   01/05/18 0952  BP: (!) 95/48  Pulse: 62  Weight: 115 lb (52.2 kg)    Fetal Status: Fetal Heart Rate (bpm): 147 Fundal Height: 26 cm Movement: Present     General:  Alert, oriented and cooperative. Patient is in no acute distress.  Skin: Skin is warm and dry. No rash noted.   Cardiovascular: Normal heart rate noted  Respiratory: Normal respiratory effort, no problems with respiration noted  Abdomen: Soft, gravid, appropriate for gestational age.  Pain/Pressure: Present     Pelvic: Cervical exam deferred        Extremities: Normal range of motion.  Edema: None  Mental Status: Normal mood and affect. Normal behavior. Normal judgment and thought content.   Assessment and Plan:  Pregnancy: G2P1001 at 746w0d  1. Supervision of high risk pregnancy, antepartum -28 wk labs today -pt doing well. Hasn't gained weight since last visit. Denies n/v/d & reports that she eats well 3-4 times per day. Fundal height appropriate & pt has growth ultrasound scheduled next month.  - Tdap vaccine greater than or equal to 7yo IM  2. Language barrier -Spanish interpreter at bedside  Preterm labor symptoms and  general obstetric precautions including but not limited to vaginal bleeding, contractions, leaking of fluid and fetal movement were reviewed in detail with the patient. Please refer to After Visit Summary for other counseling recommendations.  Return in about 2 weeks (around 01/19/2018) for Routine OB.  Future Appointments  Date Time Provider Department Center  01/31/2018  9:30 AM [facility] US 1 [facility]    Judeth HornErin Dayvin Aber, NP

## 2018-01-05 NOTE — Patient Instructions (Signed)
Tercer trimestre de embarazo (Third Trimester of Pregnancy) El tercer trimestre comprende desde la semana29 hasta la semana42, es decir, desde el mes7 hasta el mes9. El tercer trimestre es un perodo en el que el feto crece rpidamente. Hacia el final del noveno mes, el feto mide alrededor de 20pulgadas (45cm) de largo y pesa entre 6 y 10 libras (2,700 y 4,500kg). CAMBIOS EN EL ORGANISMO Su organismo atraviesa por muchos cambios durante el embarazo, y estos varan de una mujer a otra.  Seguir aumentando de peso. Es de esperar que aumente entre 25 y 35libras (11 y 16kg) hacia el final del embarazo.  Podrn aparecer las primeras estras en las caderas, el abdomen y las mamas.  Puede tener necesidad de orinar con ms frecuencia porque el feto baja hacia la pelvis y ejerce presin sobre la vejiga.  Debido al embarazo podr sentir acidez estomacal con frecuencia.  Puede estar estreida, ya que ciertas hormonas enlentecen los movimientos de los msculos que empujan los desechos a travs de los intestinos.  Pueden aparecer hemorroides o abultarse e hincharse las venas (venas varicosas).  Puede sentir dolor plvico debido al aumento de peso y a que las hormonas del embarazo relajan las articulaciones entre los huesos de la pelvis. El dolor de espalda puede ser consecuencia de la sobrecarga de los msculos que soportan la postura.  Tal vez haya cambios en el cabello que pueden incluir su engrosamiento, crecimiento rpido y cambios en la textura. Adems, a algunas mujeres se les cae el cabello durante o despus del embarazo, o tienen el cabello seco o fino. Lo ms probable es que el cabello se le normalice despus del nacimiento del beb.  Las mamas seguirn creciendo y le dolern. A veces, puede haber una secrecin amarilla de las mamas llamada calostro.  El ombligo puede salir hacia afuera.  Puede sentir que le falta el aire debido a que se expande el tero.  Puede notar que el feto  "baja" o lo siente ms bajo, en el abdomen.  Puede tener una prdida de secrecin mucosa con sangre. Esto suele ocurrir en el trmino de unos pocos das a una semana antes de que comience el trabajo de parto.  El cuello del tero se vuelve delgado y blando (se borra) cerca de la fecha de parto. QU DEBE ESPERAR EN LOS EXMENES PRENATALES Le harn exmenes prenatales cada 2semanas hasta la semana36. A partir de ese momento le harn exmenes semanales. Durante una visita prenatal de rutina:  La pesarn para asegurarse de que usted y el feto estn creciendo normalmente.  Le tomarn la presin arterial.  Le medirn el abdomen para controlar el desarrollo del beb.  Se escucharn los latidos cardacos fetales.  Se evaluarn los resultados de los estudios solicitados en visitas anteriores.  Le revisarn el cuello del tero cuando est prxima la fecha de parto para controlar si este se ha borrado. Alrededor de la semana36, el mdico le revisar el cuello del tero. Al mismo tiempo, realizar un anlisis de las secreciones del tejido vaginal. Este examen es para determinar si hay un tipo de bacteria, estreptococo Grupo B. El mdico le explicar esto con ms detalle. El mdico puede preguntarle lo siguiente:  Cmo le gustara que fuera el parto.  Cmo se siente.  Si siente los movimientos del beb.  Si ha tenido sntomas anormales, como prdida de lquido, sangrado, dolores de cabeza intensos o clicos abdominales.  Si est consumiendo algn producto que contenga tabaco, como cigarrillos, tabaco de mascar y   cigarrillos electrnicos.  Si tiene alguna pregunta. Otros exmenes o estudios de deteccin que pueden realizarse durante el tercer trimestre incluyen lo siguiente:  Anlisis de sangre para controlar los niveles de hierro (anemia).  Controles fetales para determinar su salud, nivel de actividad y crecimiento. Si tiene alguna enfermedad o hay problemas durante el embarazo, le harn  estudios.  Prueba del VIH (virus de inmunodeficiencia humana). Si corre un riesgo alto, pueden realizarle una prueba de deteccin del VIH durante el tercer trimestre del embarazo. FALSO TRABAJO DE PARTO Es posible que sienta contracciones leves e irregulares que finalmente desaparecen. Se llaman contracciones de Braxton Hicks o falso trabajo de parto. Las contracciones pueden durar horas, das o incluso semanas, antes de que el verdadero trabajo de parto se inicie. Si las contracciones ocurren a intervalos regulares, se intensifican o se hacen dolorosas, lo mejor es que la revise el mdico. SIGNOS DE TRABAJO DE PARTO  Clicos de tipo menstrual.  Contracciones cada 5minutos o menos.  Contracciones que comienzan en la parte superior del tero y se extienden hacia abajo, a la zona inferior del abdomen y la espalda.  Sensacin de mayor presin en la pelvis o dolor de espalda.  Una secrecin de mucosidad acuosa o con sangre que sale de la vagina. Si tiene alguno de estos signos antes de la semana37 del embarazo, llame a su mdico de inmediato. Debe concurrir al hospital para que la controlen inmediatamente. INSTRUCCIONES PARA EL CUIDADO EN EL HOGAR  Evite fumar, consumir hierbas, beber alcohol y tomar frmacos que no le hayan recetado. Estas sustancias qumicas afectan la formacin y el desarrollo del beb.  No consuma ningn producto que contenga tabaco, lo que incluye cigarrillos, tabaco de mascar y cigarrillos electrnicos. Si necesita ayuda para dejar de fumar, consulte al mdico. Puede recibir asesoramiento y otro tipo de recursos para dejar de fumar.  Siga las indicaciones del mdico en relacin con el uso de medicamentos. Durante el embarazo, hay medicamentos que son seguros de tomar y otros que no.  Haga ejercicio solamente como se lo haya indicado el mdico. Sentir clicos uterinos es un buen signo para detener la actividad fsica.  Contine comiendo alimentos sanos con  regularidad.  Use un sostn que le brinde buen soporte si le duelen las mamas.  No se d baos de inmersin en agua caliente, baos turcos ni saunas.  Use el cinturn de seguridad en todo momento mientras conduce.  No coma carne cruda ni queso sin cocinar; evite el contacto con las bandejas sanitarias de los gatos y la tierra que estos animales usan. Estos elementos contienen grmenes que pueden causar defectos congnitos en el beb.  Tome las vitaminas prenatales.  Tome entre 1500 y 2000mg de calcio diariamente comenzando en la semana20 del embarazo hasta el parto.  Si est estreida, pruebe un laxante suave (si el mdico lo autoriza). Consuma ms alimentos ricos en fibra, como vegetales y frutas frescos y cereales integrales. Beba gran cantidad de lquido para mantener la orina de tono claro o color amarillo plido.  Dese baos de asiento con agua tibia para aliviar el dolor o las molestias causadas por las hemorroides. Use una crema para las hemorroides si el mdico la autoriza.  Si tiene venas varicosas, use medias de descanso. Eleve los pies durante 15minutos, 3 o 4veces por da. Limite el consumo de sal en su dieta.  Evite levantar objetos pesados, use zapatos de tacones bajos y mantenga una buena postura.  Descanse con las piernas elevadas si tiene   calambres o dolor de cintura.  Visite a su dentista si no lo ha hecho durante el embarazo. Use un cepillo de dientes blando para higienizarse los dientes y psese el hilo dental con suavidad.  Puede seguir manteniendo relaciones sexuales, a menos que el mdico le indique lo contrario.  No haga viajes largos excepto que sea absolutamente necesario y solo con la autorizacin del mdico.  Tome clases prenatales para entender, practicar y hacer preguntas sobre el trabajo de parto y el parto.  Haga un ensayo de la partida al hospital.  Prepare el bolso que llevar al hospital.  Prepare la habitacin del beb.  Concurra a todas  las visitas prenatales segn las indicaciones de su mdico.  SOLICITE ATENCIN MDICA SI:  No est segura de que est en trabajo de parto o de que ha roto la bolsa de las aguas.  Tiene mareos.  Siente clicos leves, presin en la pelvis o dolor persistente en el abdomen.  Tiene nuseas, vmitos o diarrea persistentes.  Observa una secrecin vaginal con mal olor.  Siente dolor al orinar.  SOLICITE ATENCIN MDICA DE INMEDIATO SI:  Tiene fiebre.  Tiene una prdida de lquido por la vagina.  Tiene sangrado o pequeas prdidas vaginales.  Siente dolor intenso o clicos en el abdomen.  Sube o baja de peso rpidamente.  Tiene dificultad para respirar y siente dolor de pecho.  Sbitamente se le hinchan mucho el rostro, las manos, los tobillos, los pies o las piernas.  No ha sentido los movimientos del beb durante una hora.  Siente un dolor de cabeza intenso que no se alivia con medicamentos.  Su visin se modifica.  Esta informacin no tiene como fin reemplazar el consejo del mdico. Asegrese de hacerle al mdico cualquier pregunta que tenga. Document Released: 10/20/2004 Document Revised: 01/31/2014 Document Reviewed: 03/13/2012 Elsevier Interactive Patient Education  2017 Elsevier Inc.  

## 2018-01-06 LAB — CBC
Hematocrit: 32.1 % — ABNORMAL LOW (ref 34.0–46.6)
Hemoglobin: 11.4 g/dL (ref 11.1–15.9)
MCH: 31 pg (ref 26.6–33.0)
MCHC: 35.5 g/dL (ref 31.5–35.7)
MCV: 87 fL (ref 79–97)
Platelets: 265 10*3/uL (ref 150–450)
RBC: 3.68 x10E6/uL — ABNORMAL LOW (ref 3.77–5.28)
RDW: 12.2 % — ABNORMAL LOW (ref 12.3–15.4)
WBC: 5.4 10*3/uL (ref 3.4–10.8)

## 2018-01-06 LAB — RPR: RPR: NONREACTIVE

## 2018-01-06 LAB — GLUCOSE TOLERANCE, 2 HOURS W/ 1HR
Glucose, 1 hour: 84 mg/dL (ref 65–179)
Glucose, 2 hour: 87 mg/dL (ref 65–152)
Glucose, Fasting: 65 mg/dL (ref 65–91)

## 2018-01-06 LAB — HIV ANTIBODY (ROUTINE TESTING W REFLEX): HIV Screen 4th Generation wRfx: NONREACTIVE

## 2018-01-19 ENCOUNTER — Ambulatory Visit (INDEPENDENT_AMBULATORY_CARE_PROVIDER_SITE_OTHER): Payer: Self-pay | Admitting: Obstetrics and Gynecology

## 2018-01-19 VITALS — BP 102/67 | HR 96 | Wt 122.8 lb

## 2018-01-19 DIAGNOSIS — Z87898 Personal history of other specified conditions: Secondary | ICD-10-CM

## 2018-01-19 DIAGNOSIS — O0993 Supervision of high risk pregnancy, unspecified, third trimester: Secondary | ICD-10-CM

## 2018-01-19 DIAGNOSIS — Z789 Other specified health status: Secondary | ICD-10-CM

## 2018-01-19 DIAGNOSIS — O0990 Supervision of high risk pregnancy, unspecified, unspecified trimester: Secondary | ICD-10-CM

## 2018-01-19 NOTE — Progress Notes (Signed)
r 

## 2018-01-19 NOTE — Progress Notes (Signed)
Prenatal Visit Note Date: 01/19/2018 Clinic: [facility]  Subjective:  Beverly [name] is a 32 y.o. G2P1001 at 31 [redacted]w[redacted]d being seen today for ongoing prenatal care.  She is currently monitored for the following issues for this high-risk pregnancy and has Language barrier; Supervision of high risk pregnancy, antepartum; and History of low birth weight on their problem list.  Patient reports no complaints.   Contractions: Not present. Vag. Bleeding: None.  Movement: Present. Denies leaking of fluid.   The following portions of the patient's history were reviewed and updated as appropriate: allergies, current medications, past family history, past medical history, past social history, past surgical history and problem list. Problem list updated.  Objective:   Vitals:   01/19/18 0825  BP: 102/67  Pulse: 96  Weight: 122 lb 12.8 oz (55.7 kg)    Fetal Status: Fetal Heart Rate (bpm): 143 Fundal Height: 30 cm Movement: Present     General:  Alert, oriented and cooperative. Patient is in no acute distress.  Skin: Skin is warm and dry. No rash noted.   Cardiovascular: Normal heart rate noted  Respiratory: Normal respiratory effort, no problems with respiration noted  Abdomen: Soft, gravid, appropriate for gestational age. Pain/Pressure: Present     Pelvic:  Cervical exam deferred        Extremities: Normal range of motion.  Edema: None  Mental Status: Normal mood and affect. Normal behavior. Normal judgment and thought content.   Urinalysis:      Assessment and Plan:  Pregnancy: G2P1001 at 31 [redacted]w[redacted]d  1. Supervision of high risk pregnancy, antepartum Routine care. Normal 28wk labs last visit. D/w pt re: BC nv  2. Language barrier Interpreter used  3. History of low birth weight Getting serial growth u/s. Next one early january  Preterm labor symptoms and general obstetric precautions including but not limited to vaginal bleeding, contractions, leaking of fluid  and fetal movement were reviewed in detail with the patient. Please refer to After Visit Summary for other counseling recommendations.  Return in about 2 weeks (around 02/02/2018) for 2-3wk rob.   [place] BingPickens, Fariha Goto, MD

## 2018-01-24 NOTE — L&D Delivery Note (Signed)
Delivery Note Pt progressed to complete at 2317, a little over an hour after SROM. She pushed well and at 11:37 PM a viable female was delivered via Vaginal, Spontaneous (Presentation: LOA).  APGAR: 8, 9; weight: pending. Infant dried and placed on pt's abd. Cord clamped and cut by RN >1 min after birth; hospital cord blood sample collected.   Placenta status: spont ,intact .  Cord: 3 vessel  Anesthesia:  None Episiotomy: None Lacerations: None Est. Blood Loss (mL): 54  Mom to postpartum.  Baby to Couplet care / Skin to Skin.  Arabella Merles CNM 03/16/2018, 11:55 PM  Please schedule this patient for Postpartum visit in: 4 weeks with the following provider: Any provider For C/S patients schedule nurse incision check in weeks 2 weeks: no High risk pregnancy complicated by: cholestasis Delivery mode:  SVD Anticipated Birth Control:  Nexplanon PP Procedures needed: none  Schedule Integrated BH visit: no

## 2018-01-31 ENCOUNTER — Ambulatory Visit (HOSPITAL_COMMUNITY): Admission: RE | Admit: 2018-01-31 | Payer: Self-pay | Source: Ambulatory Visit

## 2018-01-31 ENCOUNTER — Encounter (HOSPITAL_COMMUNITY): Payer: Self-pay

## 2018-02-08 ENCOUNTER — Ambulatory Visit (INDEPENDENT_AMBULATORY_CARE_PROVIDER_SITE_OTHER): Payer: Self-pay | Admitting: Obstetrics and Gynecology

## 2018-02-08 ENCOUNTER — Encounter: Payer: Self-pay | Admitting: Obstetrics and Gynecology

## 2018-02-08 VITALS — BP 101/66 | HR 75 | Wt 120.1 lb

## 2018-02-08 DIAGNOSIS — O0993 Supervision of high risk pregnancy, unspecified, third trimester: Secondary | ICD-10-CM

## 2018-02-08 DIAGNOSIS — L299 Pruritus, unspecified: Secondary | ICD-10-CM

## 2018-02-08 DIAGNOSIS — Z3A31 31 weeks gestation of pregnancy: Secondary | ICD-10-CM

## 2018-02-08 DIAGNOSIS — O0990 Supervision of high risk pregnancy, unspecified, unspecified trimester: Secondary | ICD-10-CM

## 2018-02-08 DIAGNOSIS — Z87898 Personal history of other specified conditions: Secondary | ICD-10-CM

## 2018-02-08 DIAGNOSIS — Z789 Other specified health status: Secondary | ICD-10-CM

## 2018-02-08 NOTE — Progress Notes (Signed)
Subjective:  Beverly [name] is a 33 y.o. G2P1001 at 107w6dbeing seen today for ongoing prenatal care.  She is currently monitored for the following issues for this high-risk pregnancy and has Language barrier; Supervision of high risk pregnancy, antepartum; History of low birth weight; and Itching on their problem list.  Patient reports had flu like Sx last week, has resolved but now with body itching, no rash.  Contractions: Not present. Vag. Bleeding: None.  Movement: Present. Denies leaking of fluid.   The following portions of the patient's history were reviewed and updated as appropriate: allergies, current medications, past family history, past medical history, past social history, past surgical history and problem list. Problem list updated.  Objective:   Vitals:   02/08/18 1037  BP: 101/66  Pulse: 75  Weight: 120 lb 1.6 oz (54.5 kg)    Fetal Status: Fetal Heart Rate (bpm): 134   Movement: Present     General:  Alert, oriented and cooperative. Patient is in no acute distress.  Skin: Skin is warm and dry. No rash noted.   Cardiovascular: Normal heart rate noted  Respiratory: Normal respiratory effort, no problems with respiration noted  Abdomen: Soft, gravid, appropriate for gestational age. Pain/Pressure: Present     Pelvic:  Cervical exam deferred        Extremities: Normal range of motion.  Edema: None  Mental Status: Normal mood and affect. Normal behavior. Normal judgment and thought content.   Urinalysis:      Assessment and Plan:  Pregnancy: G2P1001 at 349w6d1. Supervision of high risk pregnancy, antepartum Stable  2. History of low birth weight Growth scan ordered. Growth on 21/11/19 63 %  3. Language barrier Interrupter used today  4. Itching Benadryl PRN for now - CBC - Comp Met (CMET) - Bile acids, total  Preterm labor symptoms and general obstetric precautions including but not limited to vaginal bleeding, contractions, leaking of fluid and  fetal movement were reviewed in detail with the patient. Please refer to After Visit Summary for other counseling recommendations.  No follow-ups on file.   ErChancy MilroyMD

## 2018-02-08 NOTE — Patient Instructions (Signed)
Tercer trimestre de embarazo Third Trimester of Pregnancy  El tercer trimestre comprende desde la semana28 hasta la semana40 (desde el mes7 hasta el mes9). En este trimestre, el beb en gestacin (feto) crece muy rpidamente. Hacia el final del noveno mes, el beb en gestacin mide alrededor de 20pulgadas (45cm) de largo. Pesa entre 6y 10libras (2,70y 4,50kg). Siga estas indicaciones en su casa: Medicamentos  Tome los medicamentos de venta libre y los recetados solamente como se lo haya indicado el mdico. Algunos medicamentos son seguros para tomar durante el embarazo y otros no lo son.  Tome vitaminas prenatales que contengan por lo menos 600microgramos (?g) de cido flico.  Si tiene dificultad para mover el intestino (estreimiento), tome un medicamento para ablandar las heces (laxante) si su mdico se lo autoriza. Comida y bebida   Ingiera alimentos saludables de manera regular.  No coma carne cruda ni quesos sin cocinar.  Si obtiene poca cantidad de calcio de los alimentos que ingiere, consulte a su mdico sobre la posibilidad de tomar un suplemento diario de calcio.  La ingesta diaria de cuatro o cinco comidas pequeas en lugar de tres comidas abundantes.  Evite el consumo de alimentos ricos en grasas y azcares, como los alimentos fritos y los dulces.  Para evitar el estreimiento: ? Consuma alimentos ricos en fibra, como frutas y verduras frescas, cereales integrales y frijoles. ? Beba suficiente lquido para mantener el pis (orina) claro o de color amarillo plido. Actividad  Haga ejercicios solamente como se lo haya indicado el mdico. Interrumpa la actividad fsica si comienza a tener calambres.  No levante objetos pesados, use zapatos de tacones bajos y sintese derecha.  No haga ejercicio si hace demasiado calor, hay demasiada humedad o se encuentra en un lugar de mucha altura (altitud alta).  Puede continuar teniendo relaciones sexuales, a menos que el  mdico le indique lo contrario. Alivio del dolor y del malestar  Use un sostn que le brinde buen soporte si sus mamas estn sensibles.  Haga pausas frecuentes y descanse con las piernas levantadas si tiene calambres en las piernas o dolor en la zona lumbar.  Dese baos de asiento con agua tibia para aliviar el dolor o las molestias causadas por las hemorroides. Use una crema para las hemorroides si el mdico la autoriza.  Si desarrolla venas hinchadas y abultadas (vrices) en las piernas: ? Use medias de compresin o medias de descanso como se lo haya indicado el mdico. ? Levante (eleve) los pies durante 15minutos, 3 o 4veces por da. ? Limite el consumo de sal en sus alimentos. Seguridad  Colquese el cinturn de seguridad cuando conduzca.  Haga una lista de los nmeros de telfono de emergencia, que incluya los nmeros de telfono de familiares, amigos, el hospital, as como los departamentos de polica y bomberos. Preparacin para la llegada del beb Para prepararse para la llegada de su beb:  Tome clases prenatales.  Practique ir manejando al hospital.  Visite el hospital y recorra el rea de maternidad.  Hable en su trabajo acerca de tomar licencia cuando llegue el beb.  Prepare el bolso que llevar al hospital.  Prepare la habitacin del beb.  Concurra a los controles mdicos.  Compre un asiento de seguridad orientado hacia atrs para llevar al beb en el automvil. Aprenda cmo instalarlo en el auto. Instrucciones generales  No se d baos de inmersin en agua caliente, baos turcos ni saunas.  No consuma ningn producto que contenga nicotina o tabaco, como cigarrillos y cigarrillos   electrnicos. Si necesita ayuda para dejar de fumar, consulte al mdico.  No beba alcohol.  No se haga duchas vaginales ni use tampones o toallas higinicas perfumadas.  No mantenga las piernas cruzadas durante mucho tiempo.  No haga viajes de larga distancia, excepto si es  obligatorio. Hgalos solamente si su mdico la autoriza.  Visite a su dentista si no lo ha hecho durante el embarazo. Use un cepillo de cerdas suaves para cepillarse los dientes. Psese el hilo dental con suavidad.  Evite el contacto con las bandejas sanitarias de los gatos y la tierra que estos animales usan. Estos elementos contienen bacterias que pueden causar defectos congnitos al beb y la posible prdida del beb (aborto espontneo) o la muerte fetal.  Concurra a todas las visitas prenatales como se lo haya indicado el mdico. Esto es importante. Comunquese con un mdico si:  No est segura de si est en trabajo de parto o si ha roto la bolsa de las aguas.  Tiene mareos.  Tiene clicos leves o siente presin en la parte baja del vientre.  Sufre un dolor persistente en el abdomen.  Sigue teniendo malestar estomacal, vomita o tiene heces lquidas.  Advierte un lquido con olor ftido que proviene de la vagina.  Siente dolor al orinar. Solicite ayuda de inmediato si:  Tiene fiebre.  Tiene una prdida de lquido por la vagina.  Tiene sangrado o pequeas prdidas vaginales.  Siente dolor intenso o clicos en el abdomen.  Aumenta o baja de peso rpidamente.  Tiene dificultades para recuperar el aliento y siente dolor en el pecho.  Sbitamente se le hinchan mucho el rostro, las manos, los tobillos, los pies o las piernas.  No ha sentido los movimientos del beb durante una hora.  Siente un dolor de cabeza intenso que no se alivia con medicamentos.  Tiene dificultad para ver.  Tiene prdida de lquido o le sale un chorro de lquido de la vagina antes de estar en la semana 37.  Tiene espasmos abdominales (contracciones) regulares antes de estar en la semana 37. Resumen  El tercer trimestre comprende desde la semana28 hasta la semana40 (desde el mes7 hasta el mes9). Esta es la poca en que el beb en gestacin crece muy rpidamente.  Siga los consejos del mdico  con respecto a los medicamentos, la alimentacin y la actividad.  Preprese para la llegada del beb tomando las clases prenatales, preparando todo lo que necesitar el beb, arreglando la habitacin del beb y concurriendo a los controles mdicos.  Solicite ayuda de inmediato si tiene sangrado por la vagina, siente dolor en el pecho o tiene dificultad para respirar, o si no ha sentido que su beb se mueve en el transcurso de ms de una hora. Esta informacin no tiene como fin reemplazar el consejo del mdico. Asegrese de hacerle al mdico cualquier pregunta que tenga. Document Released: 09/12/2012 Document Revised: 08/15/2016 Document Reviewed: 08/15/2016 Elsevier Interactive Patient Education  2019 Elsevier Inc.  

## 2018-02-08 NOTE — Progress Notes (Signed)
Rescheduled follow up US for 02/14/18 @ 10:30am. Pt made aware.

## 2018-02-09 LAB — COMPREHENSIVE METABOLIC PANEL
ALT: 40 IU/L — ABNORMAL HIGH (ref 0–32)
AST: 32 IU/L (ref 0–40)
Albumin/Globulin Ratio: 0.9 — ABNORMAL LOW (ref 1.2–2.2)
Albumin: 3 g/dL — ABNORMAL LOW (ref 3.5–5.5)
Alkaline Phosphatase: 168 IU/L — ABNORMAL HIGH (ref 39–117)
BILIRUBIN TOTAL: 0.9 mg/dL (ref 0.0–1.2)
BUN/Creatinine Ratio: 13 (ref 9–23)
BUN: 7 mg/dL (ref 6–20)
CO2: 22 mmol/L (ref 20–29)
Calcium: 9.1 mg/dL (ref 8.7–10.2)
Chloride: 104 mmol/L (ref 96–106)
Creatinine, Ser: 0.54 mg/dL — ABNORMAL LOW (ref 0.57–1.00)
GFR calc Af Amer: 145 mL/min/{1.73_m2} (ref >59)
GFR calc non Af Amer: 126 mL/min/{1.73_m2} (ref >59)
Globulin, Total: 3.5 g/dL (ref 1.5–4.5)
Glucose: 90 mg/dL (ref 65–99)
Potassium: 4.2 mmol/L (ref 3.5–5.2)
SODIUM: 139 mmol/L (ref 134–144)
Total Protein: 6.5 g/dL (ref 6.0–8.5)

## 2018-02-09 LAB — CBC
Hematocrit: 33.5 % — ABNORMAL LOW (ref 34.0–46.6)
Hemoglobin: 11.8 g/dL (ref 11.1–15.9)
MCH: 30.8 pg (ref 26.6–33.0)
MCHC: 35.2 g/dL (ref 31.5–35.7)
MCV: 88 fL (ref 79–97)
Platelets: 354 10*3/uL (ref 150–450)
RBC: 3.83 x10E6/uL (ref 3.77–5.28)
RDW: 12.3 % (ref 11.7–15.4)
WBC: 6.5 10*3/uL (ref 3.4–10.8)

## 2018-02-09 LAB — BILE ACIDS, TOTAL: Bile Acids Total: 94.2 umol/L (ref 0.0–10.0)

## 2018-02-12 ENCOUNTER — Other Ambulatory Visit: Payer: Self-pay | Admitting: *Deleted

## 2018-02-12 DIAGNOSIS — O26613 Liver and biliary tract disorders in pregnancy, third trimester: Principal | ICD-10-CM

## 2018-02-12 DIAGNOSIS — K831 Obstruction of bile duct: Secondary | ICD-10-CM

## 2018-02-13 ENCOUNTER — Telehealth: Payer: Self-pay | Admitting: *Deleted

## 2018-02-13 NOTE — Telephone Encounter (Addendum)
Called pt w/[employer] interpreter Onalee Hua [phone] and was unable to leave message on home # due to no voice mail set up. I then called the number for her significant other Alma Downs) and left message stating that I am calling with important test results and medication information. I stated that Samanvitha needs to call our office as soon as possible. Pt needs to be informed of new Dx: Cholestasis. Per Dr. Alysia Penna, she needs 2 medications prescribed. One of them is very expensive but a coupon is available on the [employer]. I have printed the coupons for her which she may pick up at our office and are only good for a [facility] (least expensive[facility] pharmacy. The cost will be approximately $50 for one Rx (Ursodiol) and $6 for the other (Hydroxyzine) which is optional. Pt needs to let us know which [facility] location she would like the prescriptions to be sent to. She may choose another pharmacy if preferred however the cost of medications will be higher. Pt also needs to be informed of additional appointment in our office this week on Wed. 1/22 @ 3:35 pm.   1/22 1620  Pt presented to office for scheduled appt w/Dr. Macon Large. She was informed of Dx: Cholestasis and plan of care including prescription medications.

## 2018-02-13 NOTE — Telephone Encounter (Signed)
Received a voicemessage from [facility] they are calling with an alert. I called [facility] and was told re: bile acids drawn 02/08/18. Per chart Doctor has already reviewed and ordered medication and patient has been notified by staff.

## 2018-02-14 ENCOUNTER — Other Ambulatory Visit: Payer: Self-pay | Admitting: Obstetrics and Gynecology

## 2018-02-14 ENCOUNTER — Other Ambulatory Visit (HOSPITAL_COMMUNITY): Payer: Self-pay | Admitting: *Deleted

## 2018-02-14 ENCOUNTER — Encounter (HOSPITAL_COMMUNITY): Payer: Self-pay

## 2018-02-14 ENCOUNTER — Ambulatory Visit (INDEPENDENT_AMBULATORY_CARE_PROVIDER_SITE_OTHER): Payer: Self-pay | Admitting: Obstetrics & Gynecology

## 2018-02-14 ENCOUNTER — Ambulatory Visit (HOSPITAL_COMMUNITY)
Admission: RE | Admit: 2018-02-14 | Discharge: 2018-02-14 | Disposition: A | Discharge status: Home / Self Care | Payer: Self-pay | Source: Ambulatory Visit | Attending: Obstetrics and Gynecology | Admitting: Obstetrics and Gynecology

## 2018-02-14 ENCOUNTER — Ambulatory Visit (HOSPITAL_COMMUNITY)
Admission: RE | Admit: 2018-02-14 | Discharge: 2018-02-14 | Disposition: A | Discharge status: Home / Self Care | Payer: Self-pay | Source: Ambulatory Visit | Attending: Maternal and Fetal Medicine | Admitting: Maternal and Fetal Medicine

## 2018-02-14 VITALS — BP 104/67 | HR 73 | Wt 122.4 lb

## 2018-02-14 DIAGNOSIS — K831 Obstruction of bile duct: Secondary | ICD-10-CM

## 2018-02-14 DIAGNOSIS — O26619 Liver and biliary tract disorders in pregnancy, unspecified trimester: Principal | ICD-10-CM

## 2018-02-14 DIAGNOSIS — O26613 Liver and biliary tract disorders in pregnancy, third trimester: Secondary | ICD-10-CM

## 2018-02-14 DIAGNOSIS — O09293 Supervision of pregnancy with other poor reproductive or obstetric history, third trimester: Secondary | ICD-10-CM

## 2018-02-14 DIAGNOSIS — Z3A32 32 weeks gestation of pregnancy: Secondary | ICD-10-CM

## 2018-02-14 DIAGNOSIS — O26643 Intrahepatic cholestasis of pregnancy, third trimester: Secondary | ICD-10-CM

## 2018-02-14 DIAGNOSIS — O0990 Supervision of high risk pregnancy, unspecified, unspecified trimester: Secondary | ICD-10-CM

## 2018-02-14 DIAGNOSIS — O289 Unspecified abnormal findings on antenatal screening of mother: Secondary | ICD-10-CM

## 2018-02-14 DIAGNOSIS — O0993 Supervision of high risk pregnancy, unspecified, third trimester: Secondary | ICD-10-CM

## 2018-02-14 DIAGNOSIS — O09299 Supervision of pregnancy with other poor reproductive or obstetric history, unspecified trimester: Secondary | ICD-10-CM | POA: Insufficient documentation

## 2018-02-14 MED ORDER — URSODIOL 300 MG PO CAPS: 300.0000 mg | ORAL_CAPSULE | Freq: Three times a day (TID) | ORAL | 0 refills | Status: AC

## 2018-02-14 MED ORDER — HYDROXYZINE HCL 25 MG PO TABS: 25.0000 mg | ORAL_TABLET | Freq: Four times a day (QID) | ORAL | 2 refills | Status: AC | PRN

## 2018-02-14 NOTE — Procedures (Signed)
Beverly [name] 12/19/1985 [redacted]w[redacted]d  Fetus A Non-Stress Test Interpretation for 02/14/18  Indication: Unsatisfactory BPP  Fetal Heart Rate A Mode: External Baseline Rate (A): 130 bpm Variability: Moderate Accelerations: 15 x 15 Decelerations: None Multiple birth?: No  Uterine Activity Mode: Toco Contraction Frequency (min): mild UI noted Contraction Duration (sec): 20-40 Contraction Quality: Mild Resting Tone Palpated: Relaxed Resting Time: Adequate  Interpretation (Fetal Testing) Nonstress Test Interpretation: Reactive Comments: FHR tracing rev'd by Dr. Grace Bushy

## 2018-02-14 NOTE — Progress Notes (Addendum)
   PRENATAL VISIT NOTE  Subjective:  Beverly [name] is a 33 y.o. G2P1001 at [redacted]w[redacted]d being seen today for ongoing prenatal care.  Patient is Spanish-speaking only, Spanish interpreter present for this encounter. She is currently monitored for the following issues for this high-risk pregnancy and has Language barrier; Supervision of high risk pregnancy, antepartum; History of low birth weight; and Itching on their problem list.  Patient reports continued itching, diagnosed with cholestasis recently and has a bile acid level of 94!   Contractions: Not present. Vag. Bleeding: None.  Movement: Present. Denies leaking of fluid.   The following portions of the patient's history were reviewed and updated as appropriate: allergies, current medications, past family history, past medical history, past social history, past surgical history and problem list. Problem list updated.  Objective:   Vitals:   02/14/18 1553  BP: 104/67  Pulse: 73  Weight: 122 lb 6.4 oz (55.5 kg)    Fetal Status: Fetal Heart Rate (bpm): 148   Movement: Present     General:  Alert, oriented and cooperative. Patient is in no acute distress.  Skin: Skin is warm and dry. No rash noted.   Cardiovascular: Normal heart rate noted  Respiratory: Normal respiratory effort, no problems with respiration noted  Abdomen: Soft, gravid, appropriate for gestational age.  Pain/Pressure: Present     Pelvic: Cervical exam deferred        Extremities: Normal range of motion.  Edema: None  Mental Status: Normal mood and affect. Normal behavior. Normal judgment and thought content.   Assessment and Plan:  Pregnancy: G2P1001 at [redacted]w[redacted]d  1. Cholestasis during pregnancy in third trimester Explained diagnosis and implications to patient, need for fetal surveillance and IOL at 37 weeks. Medications prescribed, she was given coupons to get them at [employer]. Patient had growth scan and BPP 8/10 today at [facility], final report pending. - ursodiol  (ACTIGALL) 300 MG capsule; Take 1 capsule (300 mg total) by mouth 3 (three) times daily.  Dispense: 90 capsule; Refill: 0 - hydrOXYzine (ATARAX/VISTARIL) 25 MG tablet; Take 1 tablet (25 mg total) by mouth every 6 (six) hours as needed for itching.  Dispense: 30 tablet; Refill: 2  2. Supervision of high risk pregnancy, antepartum Preterm labor symptoms and general obstetric precautions including but not limited to vaginal bleeding, contractions, leaking of fluid and fetal movement were reviewed in detail with the patient. Please refer to After Visit Summary for other counseling recommendations.  Return in about 1 week (around 02/21/2018) for OB Visit (HOB), NST, BPP every week.  Future Appointments  Date Time Provider Department Center  02/21/2018  8:15 AM [facility] NST [facility]  02/21/2018  9:15 AM Avereigh Spainhower, Jethro Bastos, MD [facility]  02/28/2018  8:15 AM [facility] NST [facility]  03/07/2018  8:15 AM [facility] NST [facility]  03/07/2018 11:15 AM [facility] [place] 4 [facility]  03/14/2018  8:15 AM [facility] NST [facility]    Jaynie Collins, MD

## 2018-02-14 NOTE — Patient Instructions (Signed)
Colestasis del embarazo  Cholestasis of Pregnancy    El trmino colestasis hace referencia a cualquier afeccin que provoca el enlentecimiento o la interrupcin del flujo del lquido de la digestin (bilis) que el hgado produce. La colestasis del embarazo es ms frecuente hacia el final del embarazo (tercertrimestre), pero puede presentarse en cualquier momento durante la gestacin. Con frecuencia, la afeccin desaparece pronto tras el parto.  La colestasis podra causar molestias, pero suele ser inofensiva para usted. Sin embargo, puede ser daina para el beb. La colestasis puede aumentar el riesgo de lo siguiente:   Que el beb nazca antes de tiempo (parto prematuro).   Que el beb tenga una frecuencia cardaca baja y falta de oxgeno durante el parto (estrs fetal).   Perder al beb antes del parto (muerte fetal).  Cules son las causas?  Se desconoce la causa precisa de esta afeccin, pero podra estar relacionada con lo siguiente:   Hormonas del embarazo. Normalmente, la vescula biliar contiene la bilis hasta que se la necesita para ayudar a digerir las grasas de la dieta. Las hormonas del embarazo pueden enlentecer el flujo de la bilis y hacer que retroceda al hgado. A continuacin, la bilis ingresa al torrente sanguneo y provoca sntomas de colestasis.   Cambios en los genes (mutaciones genticas). Especficamente, los genes que afectan el modo en que el hgado libera la bilis.  Qu incrementa el riesgo?  Es ms probable que desarrolle esta afeccin si:   Tuvo colestasis durante un embarazo anterior.   Tiene antecedentes familiares de colestasis.   Tiene problemas de hgado.   Usted tiene un embarazo de varios bebs, como gemelos o trillizos.  Cules son los signos o los sntomas?  El sntoma ms frecuente de esta afeccin es la picazn intensa (prurito), especialmente, en las palmas de las manos y las plantas de los pies. La picazn puede extenderse al resto del cuerpo y suele ser peor por  la noche. Generalmente, no tendr una erupcin cutnea. Otros sntomas pueden incluir lo siguiente:   Cansancio.   Dolor en la parte superior derecha del abdomen.   Orina de color oscuro.   Heces de color claro.   Inapetencia.   Coloracin amarillenta de la piel y la parte blanca de los ojos (ictericia).  Cmo se diagnostica?  Esta afeccin se diagnostica en funcin de lo siguiente:   Sus antecedentes mdicos.   Un examen fsico.   Anlisis de sangre.  Si tiene un riesgo por herencia de desarrollar esta afeccin, tambin podran realizarle un estudio gentico.  Cmo se trata?  La finalidad del tratamiento es hacer que se sienta cmoda y proteger al beb. El mdico puede hacer lo siguiente:   Recetarle algn medicamento para disminuir la cantidad de cido biliar que hay en el torrente sanguneo y ayudar a proteger al beb.   Darle vitaminaK antes del parto para evitar el sangrado excesivo.   Controlar al beb con frecuencia (control fetal).   Realizar anlisis de sangre de forma regular para controlar la funcin heptica y el nivel de bilis hasta que el beb nazca.   Recomendar el inicio (induccin) del trabajo de parto y el parto en la semana36 o37 del embarazo, o apenas los pulmones del beb alcancen el desarrollo suficiente.  Siga estas indicaciones en su casa:   Tome los medicamentos de venta libre y los recetados solamente como se lo haya indicado el mdico.   Tome baos de inmersin en agua fra para aliviar la picazn de la piel.     Use ropa cmoda, suelta y de algodn para disminuir la picazn.   Mantenga las uas cortas para no irritar la piel al rascarse.   Concurra a todas las visitas de control y visitas prenatales como se lo haya indicado el mdico. Esto es importante.  Comunquese con un mdico si:   Los sntomas empeoran a pesar del tratamiento.   Comienza a sentir dolor en el costado derecho.   Tiene hinchazn en los pies, los tobillos, las piernas o el abdomen que no es  frecuente.   Tiene fiebre.   Tiene ms sed que lo habitual.  Solicite ayuda de inmediato si:   Comienza el trabajo de parto antes de tiempo.   Siente un dolor de cabeza que no se alivia o le provoca cambios en la visin.   Tiene nuseas o vmitos.   Siente dolor intenso en el abdomen o en los hombros.   Le falta el aire.  Resumen   La colestasis del embarazo es ms frecuente hacia el final del embarazo (tercertrimestre), pero puede presentarse en cualquier momento durante la gestacin.   Con frecuencia, la afeccin desaparece tan pronto como nace el beb.   El sntoma ms frecuente de la colestasis del embarazo es la picazn intensa (prurito), especialmente, en las palmas de las manos y las plantas de los pies.   Esta afeccin podra tratarse con medicamentos, control frecuente o iniciando (induciendo) el trabajo de parto y el parto en las semanas36 o37 del embarazo.  Esta informacin no tiene como fin reemplazar el consejo del mdico. Asegrese de hacerle al mdico cualquier pregunta que tenga.  Document Released: 10/20/2004 Document Revised: 08/30/2016 Document Reviewed: 08/30/2016  Elsevier Interactive Patient Education  2019 Elsevier Inc.

## 2018-02-15 ENCOUNTER — Other Ambulatory Visit (HOSPITAL_COMMUNITY): Payer: Self-pay | Admitting: *Deleted

## 2018-02-15 DIAGNOSIS — O26619 Liver and biliary tract disorders in pregnancy, unspecified trimester: Principal | ICD-10-CM

## 2018-02-15 DIAGNOSIS — K831 Obstruction of bile duct: Secondary | ICD-10-CM

## 2018-02-21 ENCOUNTER — Ambulatory Visit: Payer: Self-pay

## 2018-02-21 ENCOUNTER — Ambulatory Visit (INDEPENDENT_AMBULATORY_CARE_PROVIDER_SITE_OTHER): Payer: Self-pay | Admitting: *Deleted

## 2018-02-21 ENCOUNTER — Encounter (HOSPITAL_COMMUNITY): Payer: Self-pay

## 2018-02-21 ENCOUNTER — Ambulatory Visit (HOSPITAL_COMMUNITY): Payer: Self-pay

## 2018-02-21 ENCOUNTER — Ambulatory Visit (INDEPENDENT_AMBULATORY_CARE_PROVIDER_SITE_OTHER): Payer: Self-pay | Admitting: Obstetrics & Gynecology

## 2018-02-21 VITALS — BP 101/68 | HR 107 | Wt 124.0 lb

## 2018-02-21 DIAGNOSIS — O0990 Supervision of high risk pregnancy, unspecified, unspecified trimester: Secondary | ICD-10-CM

## 2018-02-21 DIAGNOSIS — K831 Obstruction of bile duct: Secondary | ICD-10-CM | POA: Insufficient documentation

## 2018-02-21 DIAGNOSIS — O26643 Intrahepatic cholestasis of pregnancy, third trimester: Secondary | ICD-10-CM

## 2018-02-21 DIAGNOSIS — O26613 Liver and biliary tract disorders in pregnancy, third trimester: Secondary | ICD-10-CM

## 2018-02-21 NOTE — Progress Notes (Signed)
PRENATAL VISIT NOTE  Subjective:  Beverly [name] is a 33 y.o. G2P1001 at 2852w5d being seen today for ongoing prenatal care. Patient is Spanish-speaking only, Spanish interpreter present for this encounter. She is currently monitored for the following issues for this high-risk pregnancy and has Language barrier; Supervision of high risk pregnancy, antepartum; History of low birth weight; and Itching on their problem list.  Patient reports continued itching, not helped with one tab of Atarax.  Contractions: Not present. Vag. Bleeding: None.  Movement: Present. Denies leaking of fluid.   The following portions of the patient's history were reviewed and updated as appropriate: allergies, current medications, past family history, past medical history, past social history, past surgical history and problem list. Problem list updated.  Objective:   Vitals:   02/21/18 0830  BP: 101/68  Pulse: (!) 107  Weight: 124 lb (56.2 kg)    Fetal Status: Fetal Heart Rate (bpm): NST   Movement: Present     General:  Alert, oriented and cooperative. Patient is in no acute distress.  Skin: Skin is warm and dry. No rash noted.   Cardiovascular: Normal heart rate noted  Respiratory: Normal respiratory effort, no problems with respiration noted  Abdomen: Soft, gravid, appropriate for gestational age.  Pain/Pressure: Absent     Pelvic: Cervical exam deferred        Extremities: Normal range of motion.  Edema: None  Mental Status: Normal mood and affect. Normal behavior. Normal judgment and thought content.   [place] Mfm Fetal Bpp W/nonstress  Result Date: 02/14/2018 ----------------------------------------------------------------------  OBSTETRICS REPORT                       (Signed Final 02/14/2018 04:40 pm) ---------------------------------------------------------------------- Patient Info  ID #:       [id number]                          D.O.B.:  [birth year] (32 yrs)  Name:       Beverly [name]                  Visit Date: 02/14/2018 10:40 am              [name] ---------------------------------------------------------------------- Performed By  Performed By:     Magnus Ivaneresa Jones           Ref. Address:      [address] [place]                    RDMS, RVT                                                              Rd  Attending:        Lin Landsmanorenthian Booker      [facility]                    MD  Referred By:      Conan BowensKELLY M DAVIS                    MD ---------------------------------------------------------------------- Orders   #  Description  Code        Ordered By   1  [place] MFM OB FOLLOW UP                   [id number]    Adele Dan   2  [place] MFM FETAL BPP                      16109.6     Nettie Elm      W/NONSTRESS  ----------------------------------------------------------------------   #  Order #                     Accession #                Episode #   1  [id number]                   [id number]                 [id number]   2  [id number]                   [id number]                 [id number]  ---------------------------------------------------------------------- Indications   Poor obstetric history: Previous fetal growth   O09.299   restriction (FGR) (2265g @38w  delivery)   Encounter for fetal growth retardation          Z36.4   Cholestasis of pregnancy, third trimester       K74.259D63.8   Abnormal finding on antenatal screening         O28.9   [redacted] weeks gestation of pregnancy                 Z3A.32  ---------------------------------------------------------------------- Vital Signs                                                 Height:        5'2" ---------------------------------------------------------------------- Fetal Evaluation  Num Of Fetuses:          1  Fetal Heart              137  Rate(bpm):  Cardiac Activity:        Observed  Presentation:            Cephalic  Placenta:                Anterior  Amniotic Fluid  AFI FV:      Within normal limits  AFI Sum(cm)      %Tile       Largest Pocket(cm)  19.57           73          6.47  RUQ(cm)       RLQ(cm)       LUQ(cm)        LLQ(cm)  4.46          3.12          6.47           5.52 ---------------------------------------------------------------------- Biophysical Evaluation  Amniotic F.V:   Pocket => 2 cm two         F. Tone:         Observed  planes  F. Movement:    Observed                   Score:           6/8  F. Breathing:   Not Observed ---------------------------------------------------------------------- Biometry  BPD:      74.5  mm     G. Age:  29w 6d        < 1  %    CI:         65.62  %    70 - 86                                                          FL/HC:       20.8  %    19.9 - 21.5  HC:      295.2  mm     G. Age:  32w 4d         14  %    HC/AC:       1.07       0.96 - 1.11  AC:        276  mm     G. Age:  31w 5d         22  %    FL/BPD:      82.3  %    71 - 87  FL:       61.3  mm     G. Age:  31w 6d         18  %    FL/AC:       22.2  %    20 - 24  Est. FW:    1810   g           4 lb     37  %                     m ---------------------------------------------------------------------- Gestational Age  LMP:           34w 1d        Date:  06/20/17                 EDD:    03/27/18  U/S Today:     31w 4d                                        EDD:    04/14/18  Best:          32w 5d     Det. By:  Previous Ultrasound      EDD:    04/06/18                                      (10/23/17) ---------------------------------------------------------------------- Anatomy  Cranium:               Appears normal         Ductal Arch:            Appears normal  Cavum:  Previously seen        Diaphragm:              Previously seen  Ventricles:            Appears normal         Stomach:                Appears normal,                                                                        left sided  Choroid Plexus:        Previously seen        Abdomen:                Appears normal  Cerebellum:             Previously seen        Abdominal Wall:         Previously seen  Posterior Fossa:       Previously seen        Cord Vessels:           Previously seen  Face:                  Orbits previously      Kidneys:                Appear normal                         seen  Lips:                  Previously seen        Bladder:                Appears normal  Heart:                 Appears normal         Spine:                  Previously seen                         (4CH, axis, and                         situs  RVOT:                  Previously seen        Upper Extremities:      Previously seen  LVOT:                  Previously seen        Lower Extremities:      Previously seen  Aortic Arch:           Appears normal ---------------------------------------------------------------------- Impression  Normal interval growth.  Biophysical profile 6/8 NST  Cholestasis in prengnacy ---------------------------------------------------------------------- Recommendations  Continue weekly BPP  Repeat growth in 3-4 weeks. ----------------------------------------------------------------------               Lin Landsman, MD Electronically Signed Final Report   02/14/2018 04:40  pm ----------------------------------------------------------------------  [place] Mfm Ob Follow Up  Result Date: 02/14/2018 ----------------------------------------------------------------------  OBSTETRICS REPORT                       (Signed Final 02/14/2018 04:40 pm) ---------------------------------------------------------------------- Patient Info  ID #:       [id number]                          D.O.B.:  [birth year] (32 yrs)  Name:       TANEEKA HARDEMAN [name]                 Visit Date: 02/14/2018 10:40 am              [name] ---------------------------------------------------------------------- Performed By  Performed By:     Magnus Ivan           Ref. Address:      [address]                    RDMS, RVT                                                               Rd  Attending:        Lin Landsman      Location:          [facility]                    MD  Referred By:      Conan Bowens                    MD ---------------------------------------------------------------------- Orders   #  Description                           Code        Ordered By   1  [place] MFM OB FOLLOW UP                   76816.01    ROBERT JACOBSON   2  [place] MFM FETAL BPP                      98921.1     MICHAEL ERVIN      W/NONSTRESS  ----------------------------------------------------------------------   #  Order #                     Accession #                Episode #   1  [id number]                   [id number]                 [id number]   2  [id number]                   [id number]                 [id number]  ---------------------------------------------------------------------- Indications   Poor obstetric history: Previous fetal growth   O09.299   restriction (FGR) (2265g @38w  delivery)   Encounter for fetal  growth retardation          Z36.4   Cholestasis of pregnancy, third trimester       Z61.096E45.4O26.613K83.1   Abnormal finding on antenatal screening         O28.9   [redacted] weeks gestation of pregnancy                 Z3A.32  ---------------------------------------------------------------------- Vital Signs                                                 Height:        5'2" ---------------------------------------------------------------------- Fetal Evaluation  Num Of Fetuses:          1  Fetal Heart              137  Rate(bpm):  Cardiac Activity:        Observed  Presentation:            Cephalic  Placenta:                Anterior  Amniotic Fluid  AFI FV:      Within normal limits  AFI Sum(cm)     %Tile       Largest Pocket(cm)  19.57           73          6.47  RUQ(cm)       RLQ(cm)       LUQ(cm)        LLQ(cm)  4.46          3.12          6.47           5.52 ---------------------------------------------------------------------- Biophysical Evaluation  Amniotic F.V:   Pocket => 2 cm two          F. Tone:         Observed                  planes  F. Movement:    Observed                   Score:           6/8  F. Breathing:   Not Observed ---------------------------------------------------------------------- Biometry  BPD:      74.5  mm     G. Age:  29w 6d        < 1  %    CI:         65.62  %    70 - 86                                                          FL/HC:       20.8  %    19.9 - 21.5  HC:      295.2  mm     G. Age:  32w 4d         14  %    HC/AC:       1.07       0.96 - 1.11  AC:        276  mm  G. Age:  31w 5d         22  %    FL/BPD:      82.3  %    71 - 87  FL:       61.3  mm     G. Age:  31w 6d         18  %    FL/AC:       22.2  %    20 - 24  Est. FW:    1810   g           4 lb     37  %                     m ---------------------------------------------------------------------- Gestational Age  LMP:           34w 1d        Date:  06/20/17                 EDD:    03/27/18  U/S Today:     31w 4d                                        EDD:    04/14/18  Best:          32w 5d     Det. By:  Previous Ultrasound      EDD:    04/06/18                                      (10/23/17) ---------------------------------------------------------------------- Anatomy  Cranium:               Appears normal         Ductal Arch:            Appears normal  Cavum:                 Previously seen        Diaphragm:              Previously seen  Ventricles:            Appears normal         Stomach:                Appears normal,                                                                        left sided  Choroid Plexus:        Previously seen        Abdomen:                Appears normal  Cerebellum:            Previously seen        Abdominal Wall:         Previously seen  Posterior Fossa:       Previously seen        Cord Vessels:  Previously seen  Face:                  Orbits previously      Kidneys:                Appear normal                         seen  Lips:                   Previously seen        Bladder:                Appears normal  Heart:                 Appears normal         Spine:                  Previously seen                         (4CH, axis, and                         situs  RVOT:                  Previously seen        Upper Extremities:      Previously seen  LVOT:                  Previously seen        Lower Extremities:      Previously seen  Aortic Arch:           Appears normal ---------------------------------------------------------------------- Impression  Normal interval growth.  Biophysical profile 6/8 NST  Cholestasis in prengnacy ---------------------------------------------------------------------- Recommendations  Continue weekly BPP  Repeat growth in 3-4 weeks. ----------------------------------------------------------------------               Lin Landsman, MD Electronically Signed Final Report   02/14/2018 04:40 pm ----------------------------------------------------------------------   Assessment and Plan:  Pregnancy: G2P1001 at [redacted]w[redacted]d  1. Cholestasis during pregnancy in third trimester Continue Ursodiol and Atarax as prescribed; advised to try two tabs of Atarax as needed for itching.  NST performed today was reviewed and was found to be reactive. Subsequent BPP performed today was also reviewed and was found to be 10/10. AFI was also normal. Continue recommended antenatal testing and prenatal care.  2. Supervision of high risk pregnancy, antepartum Preterm labor symptoms and general obstetric precautions including but not limited to vaginal bleeding, contractions, leaking of fluid and fetal movement were reviewed in detail with the patient. Please refer to After Visit Summary for other counseling recommendations.  Return in about 1 week (around 02/28/2018) for as scheduled.  Future Appointments  Date Time Provider Department Center  02/28/2018  8:15 AM [facility]A NST [facility] WOC  03/07/2018  8:15 AM [facility]OC-[facility]A NST [facility]A WOC  03/07/2018   9:15 AM Amboy Bing, MD [facility] WOC  03/07/2018 11:15 AM WH-MFC [place] 4 WH-MFCUS MFC-US  03/14/2018  8:15 AM WOC-WOCA NST [facility] WOC  03/14/2018  9:15 AM Constant, Gigi Gin, MD [facility] WOC    Jaynie Collins, MD

## 2018-02-21 NOTE — Patient Instructions (Signed)
Return to clinic for any scheduled appointments or obstetric concerns, or go to [facility] for evaluation  

## 2018-02-21 NOTE — Progress Notes (Signed)
Interpreter [employer] present for encounter. [place] for growth scheduled on 2/12.    Pt informed that the ultrasound is considered a limited OB ultrasound and is not intended to be a complete ultrasound exam.  Patient also informed that the ultrasound is not being completed with the intent of assessing for fetal or placental anomalies or any pelvic abnormalities.  Explained that the purpose of today's ultrasound is to assess for presentation, BPP and amniotic fluid volume.  Patient acknowledges the purpose of the exam and the limitations of the study.

## 2018-02-23 ENCOUNTER — Encounter: Payer: Self-pay | Admitting: Family Medicine

## 2018-02-28 ENCOUNTER — Ambulatory Visit (INDEPENDENT_AMBULATORY_CARE_PROVIDER_SITE_OTHER): Payer: Self-pay | Admitting: *Deleted

## 2018-02-28 ENCOUNTER — Other Ambulatory Visit (HOSPITAL_COMMUNITY): Payer: Self-pay

## 2018-02-28 ENCOUNTER — Ambulatory Visit: Payer: Self-pay

## 2018-02-28 VITALS — BP 104/60 | HR 88 | Wt 126.4 lb

## 2018-02-28 DIAGNOSIS — O26613 Liver and biliary tract disorders in pregnancy, third trimester: Principal | ICD-10-CM

## 2018-02-28 DIAGNOSIS — O26643 Intrahepatic cholestasis of pregnancy, third trimester: Secondary | ICD-10-CM

## 2018-02-28 DIAGNOSIS — K831 Obstruction of bile duct: Secondary | ICD-10-CM

## 2018-02-28 NOTE — Progress Notes (Signed)
Interpreter [employer] present for encounter. Labs drawn as ordered by Dr. Macon Large on 1/29.   Pt informed that the ultrasound is considered a limited OB ultrasound and is not intended to be a complete ultrasound exam.  Patient also informed that the ultrasound is not being completed with the intent of assessing for fetal or placental anomalies or any pelvic abnormalities.  Explained that the purpose of today's ultrasound is to assess for presentation, BPP and amniotic fluid volume.  Patient acknowledges the purpose of the exam and the limitations of the study.

## 2018-03-01 LAB — COMP. METABOLIC PANEL (12)
ALBUMIN: 3.2 g/dL — AB (ref 3.8–4.8)
ALK PHOS: 204 IU/L — AB (ref 39–117)
AST: 17 IU/L (ref 0–40)
Albumin/Globulin Ratio: 1 — ABNORMAL LOW (ref 1.2–2.2)
BUN/Creatinine Ratio: 19 (ref 9–23)
BUN: 10 mg/dL (ref 6–20)
Bilirubin Total: 0.4 mg/dL (ref 0.0–1.2)
Calcium: 9 mg/dL (ref 8.7–10.2)
Chloride: 103 mmol/L (ref 96–106)
Creatinine, Ser: 0.53 mg/dL — ABNORMAL LOW (ref 0.57–1.00)
GFR calc Af Amer: 146 mL/min/{1.73_m2} (ref >59)
GFR, EST NON AFRICAN AMERICAN: 127 mL/min/{1.73_m2} (ref >59)
Globulin, Total: 3.1 g/dL (ref 1.5–4.5)
Glucose: 69 mg/dL (ref 65–99)
Potassium: 4.2 mmol/L (ref 3.5–5.2)
Sodium: 138 mmol/L (ref 134–144)
Total Protein: 6.3 g/dL (ref 6.0–8.5)

## 2018-03-01 LAB — BILE ACIDS, TOTAL: Bile Acids Total: 51.3 umol/L (ref 0.0–10.0)

## 2018-03-01 NOTE — Progress Notes (Signed)
Improved bile acids. Normal LFTs. Continue medications as prescribed, antenatal testing and delivery plan.

## 2018-03-05 ENCOUNTER — Telehealth (HOSPITAL_COMMUNITY): Payer: Self-pay | Admitting: *Deleted

## 2018-03-05 NOTE — Telephone Encounter (Signed)
Preadmission screen Interpreter number [phone]

## 2018-03-07 ENCOUNTER — Other Ambulatory Visit (HOSPITAL_COMMUNITY): Payer: Self-pay | Admitting: Maternal & Fetal Medicine

## 2018-03-07 ENCOUNTER — Ambulatory Visit (HOSPITAL_COMMUNITY)
Admission: RE | Admit: 2018-03-07 | Discharge: 2018-03-07 | Disposition: A | Discharge status: Home / Self Care | Payer: Self-pay | Source: Ambulatory Visit | Attending: Obstetrics and Gynecology | Admitting: Obstetrics and Gynecology

## 2018-03-07 ENCOUNTER — Ambulatory Visit (HOSPITAL_COMMUNITY): Admission: RE | Admit: 2018-03-07 | Payer: Self-pay | Source: Ambulatory Visit

## 2018-03-07 ENCOUNTER — Ambulatory Visit (INDEPENDENT_AMBULATORY_CARE_PROVIDER_SITE_OTHER): Payer: Self-pay | Admitting: Obstetrics and Gynecology

## 2018-03-07 ENCOUNTER — Ambulatory Visit (INDEPENDENT_AMBULATORY_CARE_PROVIDER_SITE_OTHER): Payer: Self-pay | Admitting: *Deleted

## 2018-03-07 ENCOUNTER — Encounter (HOSPITAL_COMMUNITY): Payer: Self-pay

## 2018-03-07 ENCOUNTER — Other Ambulatory Visit (HOSPITAL_COMMUNITY)
Admission: RE | Admit: 2018-03-07 | Discharge: 2018-03-07 | Disposition: A | Discharge status: Home / Self Care | Payer: Medicaid Other | Source: Ambulatory Visit | Attending: Obstetrics and Gynecology | Admitting: Obstetrics and Gynecology

## 2018-03-07 ENCOUNTER — Other Ambulatory Visit (HOSPITAL_COMMUNITY): Payer: Self-pay | Admitting: *Deleted

## 2018-03-07 VITALS — BP 104/63 | HR 78 | Wt 128.0 lb

## 2018-03-07 DIAGNOSIS — O26619 Liver and biliary tract disorders in pregnancy, unspecified trimester: Principal | ICD-10-CM

## 2018-03-07 DIAGNOSIS — O0990 Supervision of high risk pregnancy, unspecified, unspecified trimester: Secondary | ICD-10-CM | POA: Insufficient documentation

## 2018-03-07 DIAGNOSIS — Z3A35 35 weeks gestation of pregnancy: Secondary | ICD-10-CM

## 2018-03-07 DIAGNOSIS — K831 Obstruction of bile duct: Secondary | ICD-10-CM

## 2018-03-07 DIAGNOSIS — O26613 Liver and biliary tract disorders in pregnancy, third trimester: Secondary | ICD-10-CM

## 2018-03-07 DIAGNOSIS — O0993 Supervision of high risk pregnancy, unspecified, third trimester: Secondary | ICD-10-CM

## 2018-03-07 DIAGNOSIS — O09293 Supervision of pregnancy with other poor reproductive or obstetric history, third trimester: Secondary | ICD-10-CM

## 2018-03-07 LAB — OB RESULTS CONSOLE GC/CHLAMYDIA: Gonorrhea: NEGATIVE

## 2018-03-07 LAB — OB RESULTS CONSOLE GBS: GBS: NEGATIVE

## 2018-03-07 NOTE — ED Notes (Signed)
NST was done in clinic this AM. NST not repeated in [facility].

## 2018-03-07 NOTE — Progress Notes (Signed)
Prenatal Visit Note Date: 03/07/2018 Clinic: [facility]  Subjective:  Beverly [name] is a 33 y.o. G2P1001 at [redacted]w[redacted]d being seen today for ongoing prenatal care.  She is currently monitored for the following issues for this high-risk pregnancy and has Language barrier; Supervision of high risk pregnancy, antepartum; History of low birth weight; and Cholestasis during pregnancy in third trimester on their problem list.  Patient reports no complaints.   Contractions: Not present. Vag. Bleeding: None.  Movement: Present. Denies leaking of fluid.   The following portions of the patient's history were reviewed and updated as appropriate: allergies, current medications, past family history, past medical history, past social history, past surgical history and problem list. Problem list updated.  Objective:   Vitals:   03/07/18 0859  BP: 104/63  Pulse: 78  Weight: 128 lb (58.1 kg)    Fetal Status: Fetal Heart Rate (bpm): NST   Movement: Present     General:  Alert, oriented and cooperative. Patient is in no acute distress.  Skin: Skin is warm and dry. No rash noted.   Cardiovascular: Normal heart rate noted  Respiratory: Normal respiratory effort, no problems with respiration noted  Abdomen: Soft, gravid, appropriate for gestational age. Pain/Pressure: Present     Pelvic:  Cervical exam deferred        Extremities: Normal range of motion.  Edema: None  Mental Status: Normal mood and affect. Normal behavior. Normal judgment and thought content.   Urinalysis:      Assessment and Plan:  Pregnancy: G2P1001 at [redacted]w[redacted]d  1. Supervision of high risk pregnancy, antepartum Nexplanon - GC/Chlamydia probe amp ([place])not at [facility] - Strep Gp B NAA  2. Cholestasis during pregnancy in third trimester IOL already set up, rNST today. bpp 8/8 normal growth today, cephalic  Interpreter used  Preterm labor symptoms and general obstetric precautions including but not  limited to vaginal bleeding, contractions, leaking of fluid and fetal movement were reviewed in detail with the patient. Please refer to After Visit Summary for other counseling recommendations.  Return in about 1 week (around 03/14/2018) for as scheduled.   Brandon Bing, MD

## 2018-03-07 NOTE — Progress Notes (Signed)
Interpreter Marlen present for encounter. [place] for growth and BPP today.  IOL scheduled on 2/21.

## 2018-03-08 LAB — GC/CHLAMYDIA PROBE AMP (~~LOC~~) NOT AT ARMC
Chlamydia: NEGATIVE
Neisseria Gonorrhea: NEGATIVE

## 2018-03-09 ENCOUNTER — Other Ambulatory Visit: Payer: Self-pay | Admitting: Advanced Practice Midwife

## 2018-03-09 LAB — STREP GP B NAA: STREP GROUP B AG: NEGATIVE

## 2018-03-12 ENCOUNTER — Encounter (HOSPITAL_COMMUNITY): Payer: Self-pay

## 2018-03-12 ENCOUNTER — Ambulatory Visit (HOSPITAL_COMMUNITY)
Admission: RE | Admit: 2018-03-12 | Discharge: 2018-03-12 | Disposition: A | Discharge status: Home / Self Care | Payer: Self-pay | Source: Ambulatory Visit | Attending: Obstetrics and Gynecology | Admitting: Obstetrics and Gynecology

## 2018-03-12 DIAGNOSIS — O26613 Liver and biliary tract disorders in pregnancy, third trimester: Secondary | ICD-10-CM | POA: Insufficient documentation

## 2018-03-12 DIAGNOSIS — Z3A36 36 weeks gestation of pregnancy: Secondary | ICD-10-CM

## 2018-03-12 DIAGNOSIS — K831 Obstruction of bile duct: Secondary | ICD-10-CM | POA: Insufficient documentation

## 2018-03-12 HISTORY — DX: Liver and biliary tract disorders in pregnancy, unspecified trimester: O26.619

## 2018-03-12 HISTORY — DX: Obstruction of bile duct: K83.1

## 2018-03-12 HISTORY — DX: Intrahepatic cholestasis of pregnancy, unspecified trimester: O26.649

## 2018-03-14 ENCOUNTER — Ambulatory Visit (INDEPENDENT_AMBULATORY_CARE_PROVIDER_SITE_OTHER): Payer: Self-pay | Admitting: *Deleted

## 2018-03-14 ENCOUNTER — Encounter: Payer: Self-pay | Admitting: Obstetrics and Gynecology

## 2018-03-14 ENCOUNTER — Ambulatory Visit (INDEPENDENT_AMBULATORY_CARE_PROVIDER_SITE_OTHER): Payer: Self-pay | Admitting: Obstetrics and Gynecology

## 2018-03-14 VITALS — BP 107/70 | HR 99 | Wt 129.0 lb

## 2018-03-14 DIAGNOSIS — Z3A36 36 weeks gestation of pregnancy: Secondary | ICD-10-CM

## 2018-03-14 DIAGNOSIS — O0990 Supervision of high risk pregnancy, unspecified, unspecified trimester: Secondary | ICD-10-CM

## 2018-03-14 DIAGNOSIS — K831 Obstruction of bile duct: Secondary | ICD-10-CM

## 2018-03-14 DIAGNOSIS — O26613 Liver and biliary tract disorders in pregnancy, third trimester: Secondary | ICD-10-CM

## 2018-03-14 DIAGNOSIS — Z789 Other specified health status: Secondary | ICD-10-CM

## 2018-03-14 DIAGNOSIS — O0993 Supervision of high risk pregnancy, unspecified, third trimester: Secondary | ICD-10-CM

## 2018-03-14 NOTE — Progress Notes (Signed)
   PRENATAL VISIT NOTE  Subjective:  Beverly [name] is a 33 y.o. G2P1001 at [redacted]w[redacted]d being seen today for ongoing prenatal care.  She is currently monitored for the following issues for this high-risk pregnancy and has Language barrier; Supervision of high risk pregnancy, antepartum; History of low birth weight; and Cholestasis during pregnancy in third trimester on their problem list.  Patient reports no complaints.  Contractions: Not present. Vag. Bleeding: None.  Movement: Present. Denies leaking of fluid.   The following portions of the patient's history were reviewed and updated as appropriate: allergies, current medications, past family history, past medical history, past social history, past surgical history and problem list. Problem list updated.  Objective:   Vitals:   03/14/18 0838  BP: 107/70  Pulse: 99  Weight: 129 lb (58.5 kg)    Fetal Status: Fetal Heart Rate (bpm): NST   Movement: Present     General:  Alert, oriented and cooperative. Patient is in no acute distress.  Skin: Skin is warm and dry. No rash noted.   Cardiovascular: Normal heart rate noted  Respiratory: Normal respiratory effort, no problems with respiration noted  Abdomen: Soft, gravid, appropriate for gestational age.  Pain/Pressure: Present     Pelvic: Cervical exam deferred        Extremities: Normal range of motion.  Edema: None  Mental Status: Normal mood and affect. Normal behavior. Normal judgment and thought content.   Assessment and Plan:  Pregnancy: G2P1001 at [redacted]w[redacted]d  1. Supervision of high risk pregnancy, antepartum Patient is doing well without complaints  2. Cholestasis during pregnancy in third trimester Continue Actigall NST reviewed and reactive Patient scheduled for IOL on 2/21  3. Language barrier Spanish interpreter utilized  Preterm labor symptoms and general obstetric precautions including but not limited to vaginal bleeding, contractions, leaking of fluid and fetal  movement were reviewed in detail with the patient. Please refer to After Visit Summary for other counseling recommendations.  Return in about 5 weeks (around 04/18/2018) for PP visit.  IOL on 2/21.  Future Appointments  Date Time Provider Department Center  03/14/2018  9:15 AM Nikisha Fleece, Gigi Gin, MD [facility] WOC  03/16/2018  7:00 AM WH-BSSCHED ROOM WH-BSSCHED None    Catalina Antigua, MD

## 2018-03-14 NOTE — Progress Notes (Signed)
Interpreter Marlen present for encounter. BPP done @ MFM on 2/17.  IOL scheduled on 2/21.

## 2018-03-16 ENCOUNTER — Inpatient Hospital Stay (HOSPITAL_COMMUNITY)
Admission: RE | Admit: 2018-03-16 | Discharge: 2018-03-18 | DRG: 805 | Disposition: A | Discharge status: Home / Self Care | Payer: Medicaid Other | Attending: Obstetrics & Gynecology | Admitting: Obstetrics & Gynecology

## 2018-03-16 ENCOUNTER — Encounter (HOSPITAL_COMMUNITY): Payer: Self-pay

## 2018-03-16 DIAGNOSIS — O26613 Liver and biliary tract disorders in pregnancy, third trimester: Secondary | ICD-10-CM

## 2018-03-16 DIAGNOSIS — K831 Obstruction of bile duct: Secondary | ICD-10-CM | POA: Diagnosis present

## 2018-03-16 DIAGNOSIS — O0990 Supervision of high risk pregnancy, unspecified, unspecified trimester: Secondary | ICD-10-CM

## 2018-03-16 DIAGNOSIS — Z3A37 37 weeks gestation of pregnancy: Secondary | ICD-10-CM | POA: Diagnosis not present

## 2018-03-16 DIAGNOSIS — Z87898 Personal history of other specified conditions: Secondary | ICD-10-CM

## 2018-03-16 DIAGNOSIS — O2662 Liver and biliary tract disorders in childbirth: Principal | ICD-10-CM | POA: Diagnosis present

## 2018-03-16 DIAGNOSIS — Z789 Other specified health status: Secondary | ICD-10-CM | POA: Diagnosis present

## 2018-03-16 LAB — CBC
HCT: 36 % (ref 36.0–46.0)
Hemoglobin: 12.2 g/dL (ref 12.0–15.0)
MCH: 29.5 pg (ref 26.0–34.0)
MCHC: 33.9 g/dL (ref 30.0–36.0)
MCV: 87.2 fL (ref 80.0–100.0)
Platelets: 286 10*3/uL (ref 150–400)
RBC: 4.13 MIL/uL (ref 3.87–5.11)
RDW: 13.1 % (ref 11.5–15.5)
WBC: 7.3 10*3/uL (ref 4.0–10.5)
nRBC: 0 % (ref 0.0–0.2)

## 2018-03-16 LAB — TYPE AND SCREEN
ABO/RH(D): O POS
Antibody Screen: NEGATIVE

## 2018-03-16 MED ORDER — FENTANYL-BUPIVACAINE-NACL 0.5-0.125-0.9 MG/250ML-% EP SOLN: 12.0000 mL/h | EPIDURAL | Status: DC | PRN

## 2018-03-16 MED ORDER — LACTATED RINGERS IV SOLN
INTRAVENOUS | Status: DC
  Administered 2018-03-16: 08:00:00 via INTRAVENOUS

## 2018-03-16 MED ORDER — TERBUTALINE SULFATE 1 MG/ML IJ SOLN
0.2500 mg | Freq: Once | INTRAMUSCULAR | Status: DC | PRN
  Filled 2018-03-16: qty 1

## 2018-03-16 MED ORDER — ACETAMINOPHEN 325 MG PO TABS: 650.0000 mg | ORAL_TABLET | ORAL | Status: DC | PRN

## 2018-03-16 MED ORDER — OXYTOCIN BOLUS FROM INFUSION
500.0000 mL | Freq: Once | INTRAVENOUS | Status: AC
  Administered 2018-03-16: 500 mL via INTRAVENOUS

## 2018-03-16 MED ORDER — OXYTOCIN 40 UNITS IN NORMAL SALINE INFUSION - SIMPLE MED
1.0000 m[IU]/min | INTRAVENOUS | Status: DC
  Administered 2018-03-16: 2 m[IU]/min via INTRAVENOUS

## 2018-03-16 MED ORDER — SOD CITRATE-CITRIC ACID 500-334 MG/5ML PO SOLN
30.0000 mL | ORAL | Status: DC | PRN
  Filled 2018-03-16: qty 30

## 2018-03-16 MED ORDER — MISOPROSTOL 50MCG HALF TABLET
50.0000 ug | ORAL_TABLET | Freq: Once | ORAL | Status: AC
  Administered 2018-03-16: 50 ug via ORAL
  Filled 2018-03-16: qty 1

## 2018-03-16 MED ORDER — EPHEDRINE 5 MG/ML INJ
10.0000 mg | INTRAVENOUS | Status: DC | PRN
  Filled 2018-03-16: qty 2

## 2018-03-16 MED ORDER — OXYCODONE-ACETAMINOPHEN 5-325 MG PO TABS: 1.0000 | ORAL_TABLET | ORAL | Status: DC | PRN

## 2018-03-16 MED ORDER — OXYCODONE-ACETAMINOPHEN 5-325 MG PO TABS: 2.0000 | ORAL_TABLET | ORAL | Status: DC | PRN

## 2018-03-16 MED ORDER — LACTATED RINGERS IV SOLN: 500.0000 mL | Freq: Once | INTRAVENOUS | Status: DC

## 2018-03-16 MED ORDER — LIDOCAINE HCL (PF) 1 % IJ SOLN
30.0000 mL | INTRAMUSCULAR | Status: DC | PRN
  Filled 2018-03-16: qty 30

## 2018-03-16 MED ORDER — FENTANYL CITRATE (PF) 100 MCG/2ML IJ SOLN
100.0000 ug | INTRAMUSCULAR | Status: DC | PRN
  Administered 2018-03-16: 100 ug via INTRAVENOUS
  Filled 2018-03-16: qty 2

## 2018-03-16 MED ORDER — LACTATED RINGERS IV SOLN: 500.0000 mL | INTRAVENOUS | Status: DC | PRN

## 2018-03-16 MED ORDER — PHENYLEPHRINE 40 MCG/ML (10ML) SYRINGE FOR IV PUSH (FOR BLOOD PRESSURE SUPPORT)
80.0000 ug | PREFILLED_SYRINGE | INTRAVENOUS | Status: DC | PRN
  Filled 2018-03-16: qty 10

## 2018-03-16 MED ORDER — MISOPROSTOL 25 MCG QUARTER TABLET
25.0000 ug | ORAL_TABLET | ORAL | Status: DC | PRN
  Administered 2018-03-16: 25 ug via VAGINAL
  Filled 2018-03-16 (×2): qty 1

## 2018-03-16 MED ORDER — OXYTOCIN 40 UNITS IN NORMAL SALINE INFUSION - SIMPLE MED
2.5000 [IU]/h | INTRAVENOUS | Status: DC
  Filled 2018-03-16: qty 1000

## 2018-03-16 MED ORDER — DIPHENHYDRAMINE HCL 50 MG/ML IJ SOLN: 12.5000 mg | INTRAMUSCULAR | Status: DC | PRN

## 2018-03-16 MED ORDER — IBUPROFEN 800 MG PO TABS
800.0000 mg | ORAL_TABLET | Freq: Once | ORAL | Status: AC
  Administered 2018-03-17: 800 mg via ORAL
  Filled 2018-03-16: qty 1

## 2018-03-16 MED ORDER — ONDANSETRON HCL 4 MG/2ML IJ SOLN: 4.0000 mg | Freq: Four times a day (QID) | INTRAMUSCULAR | Status: DC | PRN

## 2018-03-16 NOTE — Progress Notes (Addendum)
LABOR PROGRESS NOTE  Beverly [name] is a 33 y.o. G2P1001 at [redacted]w[redacted]d  admitted for IOL for cholestasis.  Subjective: Patient notes doing well. Sitting comfortably watching tv. Feeling contractions but not bad. No other concerns or complaints.  Objective: BP (!) 100/57   Pulse 71   Temp 98 F (36.7 C) (Oral)   Resp 16   Ht 5' (1.524 m)   Wt 58.5 kg   LMP 06/20/2017 (Exact Date)   BMI 25.19 kg/m  or  Vitals:   03/16/18 0748 03/16/18 1005 03/16/18 1203 03/16/18 1508  BP: 115/75 101/60 101/62 (!) 100/57  Pulse: 78 75 66 71  Resp:    16  Temp:    98 F (36.7 C)  TempSrc:    Oral  Weight:      Height:        Dilation: 4 Effacement (%): 90 Cervical Position: Middle Station: -1 Presentation: Vertex Exam by:: mullis FHT: baseline rate 135, moderate varibility, + acel, no decel Toco: Ctx q2 mins  Labs: Lab Results  Component Value Date   WBC 7.3 03/16/2018   HGB 12.2 03/16/2018   HCT 36.0 03/16/2018   MCV 87.2 03/16/2018   PLT 286 03/16/2018    Patient Active Problem List   Diagnosis Date Noted  . Cholestasis during pregnancy in third trimester 02/21/2018  . History of low birth weight 10/16/2017  . Language barrier 10/11/2017  . Supervision of high risk pregnancy, antepartum 10/11/2017    Assessment / Plan: 33 y.o. G2P1001 at [redacted]w[redacted]d here for IOL for cholestasis.   Labor: minimal change with 2nd cytotec. Will begin Pitocin.  Fetal Wellbeing:  Cat1 Pain Control:  IV pain meds per mother's request Anticipated MOD:  SVD  Orpah Cobb, D.O. [facility], PGY1 03/16/2018, 4:13 PM   I have seen this patient and agree with the resident's documentation. I have examined them separately, and we have discussed the plan of care.  Allexa Acoff L. Zachery Conch, MD OB/GYN Fellow\

## 2018-03-16 NOTE — Progress Notes (Signed)
Patient ID: Beverly [name], female   DOB: [birth year], 32 y.o.   MRN: [id number]  Pt reports SROM just prior to [place] entering room; states she isn't feeling the ctx much  BP 107/53, other VSS FHR 125, +accels, no decels, occ mi variables Ctx irreg 3-6 mins, Pit @ 39mu/min Cx 4-5/80/vtx -1, clear fluid present  IUP@37 .0wks Cholestasis Early labor GBS neg  Continue titrating Pit to achieve reg ctx Anticipate beginning active labor  Arabella Merles CNM 03/16/2018 10:19 PM

## 2018-03-16 NOTE — Progress Notes (Addendum)
LABOR PROGRESS NOTE  Beverly [name] is a 33 y.o. G2P1001 at [redacted]w[redacted]d  admitted for IOL for cholestasis.  Subjective: Patient reports doing well. Feeling fetal movements and minor contractions at times. Denies leakage of fluid and only small amount of bloody show. Denies HA, vision changes, or RUQ pain. No other concerns.   Objective: BP 101/60   Pulse 75   Temp 98 F (36.7 C) (Oral)   Resp 16   Ht 5' (1.524 m)   Wt 58.5 kg   LMP 06/20/2017 (Exact Date)   BMI 25.19 kg/m  or  Vitals:   03/16/18 0745 03/16/18 0748 03/16/18 1005  BP:  115/75 101/60  Pulse:  78 75  Resp: 16    Temp: 98 F (36.7 C)    TempSrc: Oral    Weight: 58.5 kg    Height: 5' (1.524 m)      Dilation: 3.5-4 Effacement (%): 70 Cervical Position: Middle Station: -1 Presentation: Vertex Exam by:: laura, Mullis FHT: baseline rate 135, moderate varibility, + acel, no decel Toco: ctx q3-4 minutes  Labs: Lab Results  Component Value Date   WBC 7.3 03/16/2018   HGB 12.2 03/16/2018   HCT 36.0 03/16/2018   MCV 87.2 03/16/2018   PLT 286 03/16/2018    Patient Active Problem List   Diagnosis Date Noted  . Cholestasis during pregnancy in third trimester 02/21/2018  . History of low birth weight 10/16/2017  . Language barrier 10/11/2017  . Supervision of high risk pregnancy, antepartum 10/11/2017    Assessment / Plan: 33 y.o. G2P1001 at [redacted]w[redacted]d here for IOL for cholestasis.  Labor: Active management. Oral Cytotec at 1200. Recheck in 4 hours, most likely start pitocin at that time. Fetal Wellbeing:  Cat I Pain Control:  IV pain meds PRN Anticipated MOD:  SVD  Orpah Cobb, D.O. [facility], PGY1 03/16/2018, 11:59 AM   I have seen this patient and agree with the resident's documentation. I have examined them separately, and we have discussed the plan of care.  Kirsty Monjaraz L. Zachery Conch, MD OB/GYN Fellow

## 2018-03-16 NOTE — H&P (Addendum)
LABOR AND DELIVERY ADMISSION HISTORY AND PHYSICAL NOTE  Beverly [name] Beverly [name] is a 33 y.o. female G2P1001 with IUP at [redacted]w[redacted]d by 16 wk U/S presenting for IOL for cholestasis.  She reports positive fetal movement. She denies leakage of fluid or vaginal bleeding.  Prenatal History/Complications: PNC at [facility] Pregnancy complications:  - Cholestasis during Pregnancy on Actigall - History of low birth weight - Term @ 38 wks 4lbs 7 oz  Past Medical History: Past Medical History:  Diagnosis Date  . Cholestasis of pregnancy     Past Surgical History: Past Surgical History:  Procedure Laterality Date  . NO PAST SURGERIES      Obstetrical History: OB History    Gravida  2   Para  1   Term  1   Preterm      AB      Living  1     SAB      TAB      Ectopic      Multiple      Live Births  1           Social History: Social History   Socioeconomic History  . Marital status: Single    Spouse name: Not on file  . Number of children: Not on file  . Years of education: Not on file  . Highest education level: Not on file  Occupational History  . Not on file  Social Needs  . Financial resource strain: Not on file  . Food insecurity:    Worry: Not on file    Inability: Not on file  . Transportation needs:    Medical: Not on file    Non-medical: Not on file  Tobacco Use  . Smoking status: Never Smoker  . Smokeless tobacco: Never Used  Substance and Sexual Activity  . Alcohol use: No  . Drug use: No  . Sexual activity: Yes  Lifestyle  . Physical activity:    Days per week: Not on file    Minutes per session: Not on file  . Stress: Not on file  Relationships  . Social connections:    Talks on phone: Not on file    Gets together: Not on file    Attends religious service: Not on file    Active member of club or organization: Not on file    Attends meetings of clubs or organizations: Not on file    Relationship status: Not on file  Other Topics Concern   . Not on file  Social History Narrative  . Not on file    Family History: History reviewed. No pertinent family history.  Allergies: No Known Allergies  Medications Prior to Admission  Medication Sig Dispense Refill Last Dose  . hydrOXYzine (ATARAX/VISTARIL) 25 MG tablet Take 1 tablet (25 mg total) by mouth every 6 (six) hours as needed for itching. (Patient not taking: Reported on 03/07/2018) 30 tablet 2 Not Taking  . Prenatal Vit-Fe Fumarate-FA (PRENATAL MULTIVITAMIN) TABS tablet Take 1 tablet by mouth daily at 12 noon. 30 tablet 11 Taking  . ursodiol (ACTIGALL) 300 MG capsule Take 1 capsule (300 mg total) by mouth 3 (three) times daily. 90 capsule 0 Taking     Review of Systems  All systems reviewed and negative except as stated in HPI  Physical Exam Blood pressure 115/75, pulse 78, temperature 98 F (36.7 C), temperature source Oral, resp. rate 16, height 5' (1.524 m), weight 58.5 kg, last menstrual period 06/20/2017, unknown if currently breastfeeding.  General appearance: alert, oriented, NAD Lungs: normal respiratory effort Heart: regular rate Abdomen: soft, non-tender; gravid, FH appropriate for GA Extremities: No calf swelling or tenderness Presentation: cephalic Fetal monitoring: baseline HR 125 bpm, moderate variability, (+) accels Uterine activity: ctx q3-4 mins Dilation: 3.5 Effacement (%): 50 Station: -1 Exam by:: lee  Prenatal labs: ABO, Rh:   O+ Antibody:   Negative Rubella:  Immune RPR: Non Reactive (12/13 0859)  HBsAg:   Negative HIV: Non Reactive (12/13 0859)  GC/Chlamydia: Negative GBS: Negative (02/12 1009)  2-hr GTT: negative 65/84/87 Genetic screening:  Declined, CF negative Anatomy US: Normal  Prenatal Transfer Tool  Maternal Diabetes: No Genetic Screening: Declined Maternal Ultrasounds/Referrals: Normal Fetal Ultrasounds or other Referrals:  None Maternal Substance Abuse:  No Significant Maternal Medications:  None Significant  Maternal Lab Results: None  Results for orders placed or performed during the hospital encounter of 03/16/18 (from the past 24 hour(s))  CBC   Collection Time: 03/16/18  7:35 AM  Result Value Ref Range   WBC 7.3 4.0 - 10.5 K/uL   RBC 4.13 3.87 - 5.11 MIL/uL   Hemoglobin 12.2 12.0 - 15.0 g/dL   HCT 16.1 09.6 - 04.5 %   MCV 87.2 80.0 - 100.0 fL   MCH 29.5 26.0 - 34.0 pg   MCHC 33.9 30.0 - 36.0 g/dL   RDW 40.9 81.1 - 91.4 %   Platelets 286 150 - 400 K/uL   nRBC 0.0 0.0 - 0.2 %    Patient Active Problem List   Diagnosis Date Noted  . Cholestasis during pregnancy in third trimester 02/21/2018  . History of low birth weight 10/16/2017  . Language barrier 10/11/2017  . Supervision of high risk pregnancy, antepartum 10/11/2017    Assessment: Beverly [name] is a 33 y.o. G2P1001 at [redacted]w[redacted]d here for IOL for cholestasis.  #Labor: S/p Cytotec at 60. Continue to monitor #Pain: IV pain meds upon request #FWB: Cat I #ID:  GBS neg #MOF: Breast #MOC: Nexplanon  #Circ:  N/A  Joana Reamer 03/16/2018, 9:36 AM   I have seen this patient and agree with the resident's documentation. I have examined them separately, and we have discussed the plan of care.  Whittaker Lenis L. Zachery Conch, MD OB/GYN Fellow

## 2018-03-17 ENCOUNTER — Encounter (HOSPITAL_COMMUNITY): Payer: Self-pay

## 2018-03-17 LAB — RPR: RPR Ser Ql: NONREACTIVE

## 2018-03-17 MED ORDER — DIPHENHYDRAMINE HCL 25 MG PO CAPS: 25.0000 mg | ORAL_CAPSULE | Freq: Four times a day (QID) | ORAL | Status: DC | PRN

## 2018-03-17 MED ORDER — SENNOSIDES-DOCUSATE SODIUM 8.6-50 MG PO TABS
2.0000 | ORAL_TABLET | ORAL | Status: DC
  Administered 2018-03-17: 2 via ORAL
  Filled 2018-03-17: qty 2

## 2018-03-17 MED ORDER — BENZOCAINE-MENTHOL 20-0.5 % EX AERO: 1.0000 "application " | INHALATION_SPRAY | CUTANEOUS | Status: DC | PRN

## 2018-03-17 MED ORDER — TETANUS-DIPHTH-ACELL PERTUSSIS 5-2.5-18.5 LF-MCG/0.5 IM SUSP: 0.5000 mL | Freq: Once | INTRAMUSCULAR | Status: DC

## 2018-03-17 MED ORDER — COCONUT OIL OIL: 1.0000 "application " | TOPICAL_OIL | Status: DC | PRN

## 2018-03-17 MED ORDER — SIMETHICONE 80 MG PO CHEW: 80.0000 mg | CHEWABLE_TABLET | ORAL | Status: DC | PRN

## 2018-03-17 MED ORDER — PRENATAL MULTIVITAMIN CH
1.0000 | ORAL_TABLET | Freq: Every day | ORAL | Status: DC
  Administered 2018-03-17: 1 via ORAL
  Filled 2018-03-17: qty 1

## 2018-03-17 MED ORDER — IBUPROFEN 600 MG PO TABS
600.0000 mg | ORAL_TABLET | Freq: Four times a day (QID) | ORAL | Status: DC
  Administered 2018-03-17 (×4): 600 mg via ORAL
  Filled 2018-03-17 (×4): qty 1

## 2018-03-17 MED ORDER — ACETAMINOPHEN 325 MG PO TABS
650.0000 mg | ORAL_TABLET | ORAL | Status: DC | PRN
  Administered 2018-03-17: 650 mg via ORAL
  Filled 2018-03-17: qty 2

## 2018-03-17 MED ORDER — OXYCODONE HCL 5 MG PO TABS: 5.0000 mg | ORAL_TABLET | ORAL | Status: DC | PRN

## 2018-03-17 MED ORDER — ONDANSETRON HCL 4 MG/2ML IJ SOLN: 4.0000 mg | INTRAMUSCULAR | Status: DC | PRN

## 2018-03-17 MED ORDER — ONDANSETRON HCL 4 MG PO TABS: 4.0000 mg | ORAL_TABLET | ORAL | Status: DC | PRN

## 2018-03-17 MED ORDER — ZOLPIDEM TARTRATE 5 MG PO TABS: 5.0000 mg | ORAL_TABLET | Freq: Every evening | ORAL | Status: DC | PRN

## 2018-03-17 MED ORDER — DIBUCAINE 1 % RE OINT: 1.0000 "application " | TOPICAL_OINTMENT | RECTAL | Status: DC | PRN

## 2018-03-17 MED ORDER — WITCH HAZEL-GLYCERIN EX PADS: 1.0000 "application " | MEDICATED_PAD | CUTANEOUS | Status: DC | PRN

## 2018-03-17 NOTE — Lactation Note (Signed)
This note was copied from a baby's chart. Lactation Consultation Note  Patient Name: Beverly [name] Today's Date: 03/17/2018 Reason for consult: Initial assessment;Early term 37-38.6wks P2, 6 hour female infant, ETI Spanish interpreter [name] [phone]  Per mom, she BF her daughter for 2 months. Mom is active on the [facility] program in [place]. Mom's current feeding plan to breastfeed and then supplement with formula. Mom understands formula risk. Mom latched infant on right breast using cross cradle hold, infant latched well, few swallows observed infant breastfeed for 20 minutes. Mom supplemented with 12 ml of Similac Neosure with iron 22 kcal above recommended amount while LC talking with interpreter and setting up DEBP.  LC stopped mom from overfeeding infant and reinforced feeding guidelines with interpreter according to sheet given with BF and supplemental guidelines according infant age/ hours of life in ([place]). LC discussed ETI guidelines and mom will breastfeed according feeding cues, continue with STS and 8 or more feedings within 24 hours.  Interpreter help mom understand how to use DEBP, mom will pump[ after breastfeeding every 3 hours for 15 minutes on initial setting. LC discussed I & O. Mom knows to call Nurse or LC if she has any questions, concerns or need assistance with latching infant to breast. Mom made aware of O/P services, breastfeeding support groups, community resources, and our phone # for post-discharge questions.  Mom's plan: 1. Breastfeed infant and them supplement with EBM or formula according infant's age/ hours. 2. Continue to do as much STS as possible. 3. Use DEBP every 3 hours for 15 minutes.  Maternal Data Formula Feeding for Exclusion: No Has patient been taught Hand Expression?: Yes Does the patient have breastfeeding experience prior to this delivery?: Yes  Feeding Feeding Type: Formula Nipple Type: Slow - flow  LATCH  Score Latch: Grasps breast easily, tongue down, lips flanged, rhythmical sucking.  Audible Swallowing: A few with stimulation  Type of Nipple: Everted at rest and after stimulation  Comfort (Breast/Nipple): Soft / non-tender  Hold (Positioning): Assistance needed to correctly position infant at breast and maintain latch.  LATCH Score: 8  Interventions Interventions: Breast feeding basics reviewed;Assisted with latch;Skin to skin;Breast massage;Adjust position;Breast compression;Expressed milk;DEBP;Support pillows;Hand express  Lactation Tools Discussed/Used Tools: Pump Breast pump type: Double-Electric Breast Pump WIC Program: Yes Pump Review: Setup, frequency, and cleaning;Milk Storage Initiated by:: Danelle Earthly, IBCLC Date initiated:: 03/17/18   Consult Status Consult Status: Follow-up Date: 03/17/18 Follow-up type: In-patient    Danelle Earthly 03/17/2018, 6:03 AM

## 2018-03-17 NOTE — Progress Notes (Signed)
Admission information explained with dexter interpreter.  Beverly [name] [phone].  All questions answered.

## 2018-03-17 NOTE — Discharge Summary (Signed)
Postpartum Discharge Summary     Patient Name: Beverly [name] DOB: 04/01/1985 MRN: [id number]  Date of admission: 03/16/2018 Delivering Provider: Cam Hai D   Date of discharge: 03/17/2018  Admitting diagnosis: INDUCTION Intrauterine pregnancy: [redacted]w[redacted]d     Secondary diagnosis:  Active Problems:   Language barrier   History of low birth weight   Cholestasis during pregnancy in third trimester  Additional problems: none     Discharge diagnosis: Term Pregnancy Delivered                                                                                                Post partum procedures:none  Augmentation: Pitocin and Cytotec  Complications: None  Hospital course:  Induction of Labor With Vaginal Delivery   33 y.o. yo G2P1001 at [redacted]w[redacted]d was admitted to the hospital 03/16/2018 for induction of labor.  Indication for induction: Cholestasis of pregnancy.  Bile acids from January were 94.2, and then on February 5th 51.3. She was induced with cytotec x 2 doses followed by Pitocin and then had SROM leading her to active labor.  Patient had an uncomplicated labor course. Membrane Rupture Time/Date: 9:55 PM ,03/16/2018   Intrapartum Procedures: Episiotomy: None [1]                                         Lacerations:  None [1]  Patient had delivery of a Viable infant.  Information for the patient's newborn:  Elner Sula, Girl Maria [[id number]]  Delivery Method: Vaginal, Spontaneous(Filed from Delivery Summary)   03/16/2018  Details of delivery can be found in separate delivery note.  Patient had a routine postpartum course. Patient is discharged home 03/17/18.  Magnesium Sulfate recieved: No BMZ received: No  Physical exam  Vitals:   03/16/18 1929 03/16/18 2020 03/16/18 2126 03/16/18 2220  BP: 113/76 118/70 (!) 107/53 121/72  Pulse: 64 (!) 58 (!) 57 68  Resp:      Temp:    (!) 97.4 F (36.3 C)  TempSrc:    Oral  Weight:      Height:       General: alert, cooperative  and no distress Lochia: appropriate Uterine Fundus: firm Incision: N/A DVT Evaluation: No evidence of DVT seen on physical exam. Labs: Lab Results  Component Value Date   WBC 7.3 03/16/2018   HGB 12.2 03/16/2018   HCT 36.0 03/16/2018   MCV 87.2 03/16/2018   PLT 286 03/16/2018   CMP Latest Ref Rng & Units 02/28/2018  Glucose 65 - 99 mg/dL 69  BUN 6 - 20 mg/dL 10  Creatinine 0.94 - 7.09 mg/dL 6.28(Z)  Sodium 662 - 947 mmol/L 138  Potassium 3.5 - 5.2 mmol/L 4.2  Chloride 96 - 106 mmol/L 103  CO2 20 - 29 mmol/L -  Calcium 8.7 - 10.2 mg/dL 9.0  Total Protein 6.0 - 8.5 g/dL 6.3  Total Bilirubin 0.0 - 1.2 mg/dL 0.4  Alkaline Phos 39 - 117 IU/L 204(H)  AST 0 - 40 IU/L  17  ALT 0 - 32 IU/L -    Discharge instruction: per After Visit Summary and "Baby and Me Booklet".  After visit meds:  Allergies as of 03/18/2018   No Known Allergies     Medication List    TAKE these medications   hydrOXYzine 25 MG tablet Commonly known as:  ATARAX/VISTARIL Take 1 tablet (25 mg total) by mouth every 6 (six) hours as needed for itching.   ibuprofen 600 MG tablet Commonly known as:  ADVIL,MOTRIN Take 1 tablet (600 mg total) by mouth every 6 (six) hours.   prenatal multivitamin Tabs tablet Take 1 tablet by mouth daily at 12 noon.   ursodiol 300 MG capsule Commonly known as:  ACTIGALL Take 1 capsule (300 mg total) by mouth 3 (three) times daily.       Diet: routine diet  Activity: Advance as tolerated. Pelvic rest for 6 weeks.   Outpatient follow up:4 weeks Follow up Appt:No future appointments. Follow up Visit:   Please schedule this patient for Postpartum visit in: 4 weeks with the following provider: Any provider For C/S patients schedule nurse incision check in weeks 2 weeks: no High risk pregnancy complicated by: cholestasis Delivery mode:  SVD Anticipated Birth Control:  Nexplanon PP Procedures needed: none  Schedule Integrated BH visit: no    Newborn Data: Live born  female  Birth Weight:   APGAR: 8, 9  Newborn Delivery   Birth date/time:  03/16/2018 23:37:09 Delivery type:  Vaginal, Spontaneous     Baby Feeding: Breast Disposition:home with mother   03/17/2018 Arabella Merles, CNM  Discharge summary updated  Agree with information in note Will discharge home RTC 4 weeks  Valora Piccolo 03/18/2018 7124

## 2018-03-17 NOTE — Progress Notes (Addendum)
**   Visit with Spanish interpreter**  Post Partum Day #1 Subjective: no complaints, up ad lib and tolerating PO; breastfeeding going well; plans on Nexplanon for contraception  Objective: Blood pressure 96/63, pulse 60, temperature 97.9 F (36.6 C), temperature source Oral, resp. rate 18, height 5' (1.524 m), weight 58.5 kg, last menstrual period 06/20/2017, unknown if currently breastfeeding.  Physical Exam:  General: alert, cooperative and no distress Lochia: appropriate Uterine Fundus: firm DVT Evaluation: No evidence of DVT seen on physical exam.  Recent Labs    03/16/18 0735  HGB 12.2  HCT 36.0    Assessment/Plan: Plan for discharge tomorrow   LOS: 1 day   Arabella Merles CNM 03/17/2018, 8:22 AM   I have reviewed and updated discharge information Agree with note Aviva Signs, CNM 03/18/2018 2263

## 2018-03-18 MED ORDER — IBUPROFEN 600 MG PO TABS: 600.0000 mg | ORAL_TABLET | Freq: Four times a day (QID) | ORAL | 0 refills | Status: AC

## 2018-03-18 NOTE — Discharge Instructions (Signed)
Parto vaginal, cuidados de puerperio  Postpartum Care After Vaginal Delivery  Lea esta informacin sobre cmo cuidarse desde el momento en que nazca su beb y hasta 6 a 12 semanas despus del parto (perodo del posparto). El mdico tambin podr darle instrucciones ms especficas. Comunquese con su mdico si tiene problemas o preguntas.  Siga estas indicaciones en su casa:  Hemorragia vaginal   Es normal tener un poco de hemorragia vaginal (loquios) despus del parto. Use un apsito sanitario para el sangrado vaginal y secrecin.  ? Durante la primera semana despus del parto, la cantidad y el aspecto de los loquios a menudo es similar a las del perodo menstrual.  ? Durante las siguientes semanas disminuir gradualmente hasta convertirse en una secrecin seca amarronada o amarillenta.  ? En la mayora de las mujeres, los loquios se detienen completamente entre 4 a 6semanas despus del parto. Los sangrados vaginales pueden variar de mujer a mujer.   Cambie los apsitos sanitarios con frecuencia. Observe si hay cambios en el flujo, como:  ? Un aumento repentino en el volumen.  ? Cambio en el color.  ? Cogulos sanguneos grandes.   Si expulsa un cogulo de sangre por la vagina, gurdelo y llame al mdico para informrselo. No deseche los cogulos de sangre por el inodoro antes de hablar con su mdico.   No use tampones ni se haga duchas vaginales hasta que el mdico la autorice.   Si no est amamantando, volver a tener su perodo entre 6 y 8 semanas despus del parto. Si solamente alimenta al beb con leche materna (lactancia materna exclusiva), podra no volver a tener su perodo hasta que deje de amamantar.  Cuidados perineales   Mantenga la zona entre la vagina y el ano (perineo) limpia y seca, como se lo haya indicado el mdico. Utilice apsitos o aerosoles analgsicos y cremas, como se lo hayan indicado.   Si le hicieron un corte en el perineo (episiotoma) o tuvo un desgarro en la vagina, controle la  zona para detectar signos de infeccin hasta que sane. Est atenta a los siguientes signos:  ? Aumento del enrojecimiento, la hinchazn o el dolor.  ? Presenta lquido o sangre que supura del corte o desgarro.  ? Calor.  ? Pus o mal olor.   Es posible que le den una botella rociadora para que use en lugar de limpiarse el rea con papel higinico despus de usar el bao. Cuando comience a sanar, podr usar la botella rociadora antes de secarse. Asegrese de secarse suavemente.   Para aliviar el dolor causado por una episiotoma, un desgarro en la vagina o venas hinchadas en el ano (hemorroides), trate de tomar un bao de asiento tibio 2 o 3 veces por da. Un bao de asiento es un bao de agua tibia que se toma mientras se est sentado. El agua solo debe llegar hasta las caderas y cubrir las nalgas.  Cuidado de las mamas   En los primeros das despus del parto, las mamas pueden sentirse pesadas, llenas e incmodas (congestin mamaria). Tambin puede escaparse leche de sus senos. El mdico puede sugerirle mtodos para aliviar este malestar. La congestin mamaria debera desaparecer al cabo de unos das.   Si est amamantando:  ? Use un sostn que sujete y ajuste bien sus pechos.  ? Mantenga los pezones secos y limpios. Aplquese cremas y ungentos, como se lo haya indicado el mdico.  ? Es posible que deba usar discos de algodn en el sostn para absorber   la leche que se filtre de sus senos.  ? Puede tener contracciones uterinas cada vez que amamante durante varias semanas despus del parto. Las contracciones uterinas ayudan al tero a regresar a su tamao habitual.  ? Si tiene algn problema con la lactancia materna, colabore con el mdico o un asesor en lactancia.   Si no est amamantando:  ? Evite tocarse mucho las mamas. Al hacerlo, podran producir ms leche.  ? Use un sostn que le proporcione el ajuste correcto y compresas fras para reducir la hinchazn.  ? No extraiga (saque) leche materna. Esto har que  produzca ms leche.  Intimidad y sexualidad   Pregntele al mdico cundo puede retomar la actividad sexual. Esto puede depender de lo siguiente:  ? Su riesgo de sufrir infecciones.  ? La rapidez con la que est sanando.  ? Su comodidad y deseo de retomar la actividad sexual.   Despus del parto, puede quedar embarazada incluso si no ha tenido todava su perodo. Si lo desea, hable con el mdico acerca de los mtodos de control de la natalidad (mtodos anticonceptivos).  Medicamentos   Tome los medicamentos de venta libre y los recetados solamente como se lo haya indicado el mdico.   Si le recetaron un antibitico, tmelo como se lo haya indicado el mdico. No deje de tomar el antibitico aunque comience a sentirse mejor.  Actividad   Retome sus actividades normales de a poco como se lo haya indicado el mdico. Pregntele al mdico qu actividades son seguras para usted.   Descanse todo lo que pueda. Trate de descansar o tomar una siesta mientras el beb duerme.  Comida y bebida     Beba suficiente lquido como para mantener la orina de color amarillo plido.   Coma alimentos ricos en fibras todos los das. Estos pueden ayudarla a prevenir o aliviar el estreimiento. Los alimentos ricos en fibras incluyen, entre otros:  ? Panes y cereales integrales.  ? Arroz integral.  ? Frijoles.  ? Frutas y verduras frescas.   No intente perder de peso rpidamente reduciendo el consumo de caloras.   Tome sus vitaminas prenatales hasta la visita de seguimiento de posparto o hasta que su mdico le indique que puede dejar de tomarlas.  Estilo de vida   No consuma ningn producto que contenga nicotina o tabaco, como cigarrillos y cigarrillos electrnicos. Si necesita ayuda para dejar de fumar, consulte al mdico.   No beba alcohol, especialmente si est amamantando.  Instrucciones generales   Concurra a todas las visitas de seguimiento para usted y el beb, como se lo haya indicado el mdico. La mayora de las mujeres  visita al mdico para un seguimiento de posparto dentro de las primeras 3 a 6 semanas despus del parto.  Comunquese con un mdico si:   Se siente incapaz de controlar los cambios que implica tener un hijo y esos sentimientos no desaparecen.   Siente tristeza o preocupacin de forma inusual.   Las mamas se ponen rojas, le duelen o se endurecen.   Tiene fiebre.   Tiene dificultad para retener la orina o para impedir que la orina se escape.   Tiene poco inters o falta de inters en actividades que solan gustarle.   No ha amamantado nada y no ha tenido un perodo menstrual durante 12 semanas despus del parto.   Dej de amamantar al beb y no ha tenido su perodo menstrual durante 12 semanas despus de dejar de amamantar.   Tiene preguntas sobre su cuidado   y el del beb.   Elimina un cogulo de sangre grande por la vagina.  Solicite ayuda de inmediato si:   Siente dolor en el pecho.   Tiene dificultad para respirar.   Tiene un dolor repentino e intenso en la pierna.   Tiene dolor intenso o clicos en el la parte inferior del abdomen.   Tiene una hemorragia tan intensa de la vagina que empapa ms de un apsito en una hora. El sangrado no debe ser ms abundante que el perodo ms intenso que haya tenido.   Dolor de cabeza intenso.   Se desmaya.   Tiene visin borrosa o manchas en la vista.   Tiene secrecin vaginal con mal olor.   Tiene pensamientos acerca de lastimarse a usted misma o a su beb.  Si alguna vez siente que puede lastimarse a usted misma o a otras personas, o tiene pensamientos de poner fin a su vida, busque ayuda de inmediato. Puede dirigirse al departamento de emergencias ms cercano o llamar a:   El servicio de emergencias de su localidad (911 en [place]   Una lnea de asistencia al suicida y atencin en crisis, como la Lnea Nacional de Prevencin del Suicidio (National Suicide Prevention Lifeline), al 1-800-273-8255. Est disponible las 24 horas del da.  Resumen   El  perodo de tiempo justo despus el parto y hasta 6 a 12 semanas despus del parto se denomina perodo posparto.   Retome sus actividades normales de a poco como se lo haya indicado el mdico.   Concurra a todas las visitas de seguimiento para usted y el beb, como se lo haya indicado el mdico.  Esta informacin no tiene como fin reemplazar el consejo del mdico. Asegrese de hacerle al mdico cualquier pregunta que tenga.  Document Released: 11/07/2006 Document Revised: 01/01/2017 Document Reviewed: 01/01/2017  Elsevier Interactive Patient Education  2019 [employer]

## 2018-03-18 NOTE — Lactation Note (Signed)
This note was copied from a baby's chart. Lactation Consultation Note  Patient Name: Girl Lakyah Kobe EGBTD'V Date: 03/18/2018   Baby 31 hours old.  Interpreter [phone] for Spanish and Eda used. Provided LPI feeding plan and Eda will interpret for mother. Feed on demand approximately 8-12 times per day at least q 3 hours.  Reviewed engorgement care and monitoring voids/stools. Mother is breastfeeding and giving formula supplementation after. Provided mother with manual pump.      Maternal Data    Feeding Feeding Type: Breast Fed  LATCH Score                   Interventions    Lactation Tools Discussed/Used     Consult Status      Hardie Pulley 03/18/2018, 6:39 AM

## 2018-03-21 ENCOUNTER — Other Ambulatory Visit: Payer: Self-pay

## 2018-03-28 ENCOUNTER — Other Ambulatory Visit: Payer: Self-pay

## 2018-04-16 ENCOUNTER — Encounter: Payer: Self-pay | Admitting: *Deleted

## 2018-04-16 ENCOUNTER — Ambulatory Visit (INDEPENDENT_AMBULATORY_CARE_PROVIDER_SITE_OTHER): Payer: Self-pay | Admitting: Advanced Practice Midwife

## 2018-04-16 ENCOUNTER — Encounter: Payer: Self-pay | Admitting: Advanced Practice Midwife

## 2018-04-16 ENCOUNTER — Other Ambulatory Visit: Payer: Self-pay

## 2018-04-16 DIAGNOSIS — Z3042 Encounter for surveillance of injectable contraceptive: Secondary | ICD-10-CM

## 2018-04-16 MED ORDER — MEDROXYPROGESTERONE ACETATE 150 MG/ML IM SUSP
150.0000 mg | Freq: Once | INTRAMUSCULAR | Status: AC
  Administered 2018-04-16: 150 mg via INTRAMUSCULAR

## 2018-04-16 NOTE — Patient Instructions (Addendum)
Etonogestrel implant Qu es este medicamento? El ETONOGESTREL es un dispositivo anticonceptivo (control de la natalidad). [place] para evitar los embarazos. Se puede usar hasta por 3 aos. Este medicamento puede ser utilizado para otros usos; si tiene alguna pregunta consulte con su proveedor de atencin mdica o con su farmacutico. MARCAS COMUNES: Implanon, Nexplanon Qu le debo informar a mi profesional de la salud antes de tomar este medicamento? Necesitan saber si usted presenta alguno de los siguientes problemas o situaciones: sangrado vaginal anormal enfermedad vascular o cogulos sanguneos cncer de mama, cervical, endometrio, ovario, hgado o uterino diabetes enfermedad de la vescula biliar enfermedad cardiaca o ataque cardiaco reciente presin sangunea alta colesterol o triglicridos elevados enfermedad renal enfermedad heptica migraas convulsiones accidente cerebrovascular fuma tabaco una reaccin alrgica o inusual al etonogestrel, a anestsicos o antispticos, a otros medicamentos, alimentos, colorantes o conservantes si est embarazada o buscando quedar embarazada si est amamantando a un beb Cmo debo utilizar este medicamento? Un profesional de la salud inserta este dispositivo justo debajo de la piel en la parte interior del brazo. Hable con su pediatra para informarse acerca del uso de este medicamento en nios. Puede requerir atencin especial. Sobredosis: Pngase en contacto inmediatamente con un centro toxicolgico o una sala de urgencia si usted cree que haya tomado demasiado medicamento. ATENCIN: [employer] es solo para usted. No comparta este medicamento con nadie. Qu sucede si me olvido de una dosis? No se aplica en este caso. Qu puede interactuar con este medicamento? No tome este medicamento con ninguno de los siguientes frmacos: amprenavir fosamprenavir Esta medicina tambin puede interactuar con los siguientes medicamentos: acitretina aprepitant  armodafinilo bexaroteno bosentano carbamazepina ciertos medicamentos para infecciones micticas, tales como fluconazol, ketoconazol, itraconazol y voriconazol ciertos medicamentos para tratar la hepatitis, el VIH o SIDA ciclosporina felbamato griseofulvina lamotrigina modafinilo oxcarbazepina fenobarbital fenitona primidona rifabutina rifampicina rifapentina hierba de [place] topiramato Puede ser que esta lista no menciona todas las posibles interacciones. Informe a su profesional de KB Home	[place] de [employer] productos a base de hierbas, medicamentos de [place] o suplementos nutritivos que est tomando. Si usted fuma, consume bebidas alcohlicas o si utiliza drogas ilegales, indqueselo tambin a su profesional de KB Home	[place]. Algunas sustancias pueden interactuar con su medicamento. A qu debo estar atento al usar [employer]? Este producto no la protege de la infeccin por VIH (SIDA) ni de ninguna otra enfermedad de transmisin sexual. Usted debe poder sentir el implante al presionar la piel donde se insert con la yema de los dedos. Contacte a su mdico si no puede sentir el implante, y use un mtodo anticonceptivo no hormonal (como condones) hasta que su mdico confirme que el implante est en su Environmental consultant. Contacte a su mdico si cree que el implante puede haberse roto o doblado dentro del brazo. Su proveedor de Freight forwarder de Child psychotherapist una tarjeta de usuario despus de insertar el implante. La tarjeta es un registro de la ubicacin del implante en su brazo e indica cundo debe retirarse. Conserve esta tarjeta con sus registros mdicos. Qu efectos secundarios puedo tener al [employer] este medicamento? Efectos secundarios que debe informar a su mdico o a Barrister's clerk de la salud tan pronto como sea posible: Chief of Staff, como erupcin cutnea, comezn/picazn o urticaria, e hinchazn de la cara, los labios o la lengua bultos en las [place], cambios en el tejido de las mamas o  Soil scientist de las mamas problemas respiratorios cambios en las emociones o el estado de nimo si  siente que el implante puede haberse roto o doblado dentro del brazo. presin sangunea alta dolor, irritacin, hinchazn o moretones en el lugar de la insercin Immunologist de la insercin signos de infeccin en el sitio de insercin tales como fiebre, y enrojecimiento de la piel, Engineer, mining o secrecin signos y sntomas de un cogulo sanguneo, tales como problemas respiratorios; cambios en la visin; dolor en el pecho; dolor de cabeza grave, repentino; dolor, hinchazn, calor en la pierna; dificultad para hablar; entumecimiento o debilidad repentina de la cara, el brazo o la pierna signos y sntomas de lesin al hgado, como orina amarilla oscura o [place]; sensacin general de estar enfermo o sntomas gripales; heces claras; prdida del apetito; nuseas; dolor en la regin abdominal superior derecha; cansancio o debilidad inusual; color amarillento de los ojos o la piel sangrado vaginal inusual, secrecin Efectos secundarios que generalmente no requieren atencin mdica (infrmelos a su mdico o a Producer, television/film/video de la salud si persisten o si son molestos): acn dolor o sensibilidad de las Hydrologist de cabeza sangrado menstrual irregular nuseas Puede ser que esta lista no menciona todos los posibles efectos secundarios. Comunquese a su mdico por asesoramiento mdico [employer]. Usted puede informar los efectos secundarios a la FDA por telfono al 1-800-FDA-1088. Dnde debo guardar mi medicina? Este medicamento se administra en hospitales o clnicas y no necesitar guardarlo en su domicilio. ATENCIN: Este folleto es un resumen. Puede ser que no cubra toda la posible informacin. Si usted tiene preguntas acerca de esta medicina, consulte con su mdico, su farmacutico o su profesional de Radiographer, therapeutic.  2019 Elsevier/Gold Standard (2017-02-17 00:00:00)   Anticonceptivos  inyectables Contraceptive Injection Los anticonceptivos inyectables previenen el embarazo mediante la aplicacin de una inyeccin. Se denomina tambin inyeccin anticonceptiva. La inyeccin contiene una hormona llamada progestina, que previene el embarazo al:  Impedir que los ovarios liberen vulos.  Hacer que el moco cervical se espese a fin de evitar que los espermatozoides ingresen al cuello uterino.  Estrechar el revestimiento interior del tero a fin de evitar que un vulo fecundado se adhiera al tero. Las inyecciones anticonceptivas se administran debajo de la piel (son subcutneas) o dentro del msculo (son intramusculares). Para que estas inyecciones funcionen, un mdico debe aplicarle una cada 3 meses (12 semanas). Informe al mdico acerca de lo siguiente:  Cualquier alergia que tenga.  Todos los Walt Disney, incluidos vitaminas, hierbas, gotas oftlmicas, cremas y [address] de [address].  Cualquier enfermedad de la sangre que tenga.  Cualquier afeccin mdica que tenga.  Si est embarazada o podra estarlo. Cules son los riesgos? En general, se trata de un procedimiento seguro. Sin embargo, pueden ocurrir complicaciones, por ejemplo:  Cambios de humor o depresin.  Prdida de densidad sea (osteoporosis) luego de un uso a largo plazo.  Cogulos de [place].  Riesgo mayor de que un vulo sea fertilizado fuera del tero (embarazo ectpico).Esto es poco frecuente. Qu ocurre antes del procedimiento?  El [employer]ot har un examen fsico de [place].  Se le administrar una prueba para ver si est embarazada. Qu ocurre durante el procedimiento?  Se limpiar y se desinfectar la zona donde se aplicar la inyeccin.  Se insertar una aguja en un msculo del antebrazo o la nalga, o debajo de la piel del muslo o del abdomen. La aguja estar conectada a una jeringa que contendr [employer].  El medicamento se empujar a travs de la [place] y se  Tourist information centre manager en el cuerpo.  Tal vez le coloquen una venda (vendaje) sobre el sitio de la inyeccin. Qu puedo esperar despus del procedimiento?  Despus del procedimiento, es comn DIRECTV siguientes sntomas: ? Engineer, mining alrededor del sitio de la inyeccin durante un par [employer]. ? Sangrados menstruales irregulares. ? Aumento de [place]. ? Dolor a Multimedia programmer. ? Dolores de [place]. ? Molestias en el abdomen.  Pregntele al mdico si necesita usar un mtodo anticonceptivo adicional (anticoncepcin de respaldo), como un condn, [place] o espermicida. ? Si se administra la primera inyeccin entre 1 a 7 [address] del comienzo de su ltimo perodo, no Programmer, systems de respaldo. ? Si se administra la primera inyeccin en cualquier otro momento del ciclo menstrual, debe Special educational needs teacher relaciones sexuales o necesitar anticoncepcin de respaldo durante 7 das luego de recibir la inyeccin. Siga estas indicaciones en su casa: Instrucciones generales   [employer] de venta libre y los recetados solamente como se lo haya indicado el mdico.  No masajee el sitio de la inyeccin.  Lleve un registro de sus perodos menstruales para saber si se vuelven irregulares.  Use siempre un preservativo para protegerse contra las infecciones de transmisin sexual (ITS).  Asegrese de Engineer, water cita a tiempo para su prxima inyeccin, y [place] en su calendario. A fin de que el mtodo anticonceptivo evite el [place], debe recibir las inyecciones cada 3 meses (12semanas). Estilo de vida  No consuma ningn producto que contenga nicotina o tabaco, como cigarrillos y Administrator, Civil Service. Si necesita ayuda para dejar de fumar, consulte al mdico.  Consuma alimentos ricos en calcio y vitaminaD, como [place], queso y salmn. Esto puede ayudar a Oncologist prdida en la densidad sea provocada por los anticonceptivos inyectables. Pdale recomendaciones al mdico  respecto de la dieta. Comunquese con un mdico si:  Tiene nuseas o vmitos.  Tiene secrecin o sangrado vaginal anormal.  No tiene su perodo menstrual o piensa que podra estar embarazada.  Presenta cambios en el estado de nimo o depresin.  Se siente mareada o siente que va a desvanecerse.  Siente dolor en la pierna. Solicite ayuda de inmediato si:  Midwife.  Tose y escupe sangre.  Le falta el aire.  Siente un dolor de cabeza intenso que no se [place].  Siente que alguna parte del cuerpo est entumecida.  Tiene dificultad para hablar.  Tiene problemas de visin.  Tiene sangrado vaginal que es anormalmente abundante o no se detiene.  Siente un dolor intenso en el abdomen.  Tiene depresin que no mejora con Scientist, research (medical). Si alguna vez siente que puede lastimarse a usted misma o a Economist, o tiene pensamientos de poner fin a su vida, busque ayuda de inmediato. Puede dirigirse al servicio de emergencias ms cercano o comunicarse con:  El servicio de emergencias de su localidad (911 en [place].UU.).  Una lnea de asistencia al suicida y Visual merchandiser en crisis, como la [employer] de Prevencin del Suicidio (National Suicide Prevention Lifeline), al [phone]. Est disponible las 24 horas del da. Resumen  Los anticonceptivos inyectables previenen el embarazo mediante la aplicacin de una inyeccin. Se denomina tambin inyeccin anticonceptiva.  Esta inyeccin se administra debajo de la piel (subcutnea) o en un msculo (intramuscular).  Despus de este procedimiento, es frecuente Surveyor, mining alrededor del sitio de la inyeccin durante un par [place].  Para evitar el embarazo, un mdico debe administrar la inyeccin cada 3 meses (12 semanas).  Luego de recibir la inyeccin, pregntele  al mdico si necesita usar un mtodo anticonceptivo adicional (anticoncepcin de respaldo), como un condn, [place] o espermicida. Esta informacin no tiene  Theme park manager el consejo del mdico. Asegrese de hacerle al mdico cualquier pregunta que tenga. Document Released: 11/07/2016 Document Revised: 11/07/2016 Document Reviewed: 11/07/2016 Elsevier Interactive Patient Education  2019 [employer].

## 2018-04-16 NOTE — Progress Notes (Signed)
Subjective:     Beverly [name] is a 33 y.o. female who presents for a postpartum visit. She is 6 weeks postpartum following a spontaneous vaginal delivery. I have fully reviewed the prenatal and intrapartum course. The delivery was at 37 gestational weeks. Outcome: spontaneous vaginal delivery. Anesthesia: none. Postpartum course has been UNREMARKABLE. Baby's course has been unremarkable. Baby is feeding by both breast and bottle - Similac with Iron. Bleeding staining only. Bowel function is normal. Bladder function is normal. Patient is not sexually active. Contraception method is Nexplanon. Postpartum depression screening: negative.  The following portions of the patient's history were reviewed and updated as appropriate: allergies, current medications, past family history, past medical history, past social history, past surgical history and problem list.  Last pap: 10/28/2015- will need pap in one year   Review of Systems Pertinent items are noted in HPI.   Objective:    BP 115/66   Pulse 60   Wt 116 lb (52.6 kg)   BMI 22.65 kg/m    Physical Exam  Constitutional: She is oriented to person, place, and time and well-developed, well-nourished, and in no distress. No distress.  HENT:  Head: Normocephalic.  Cardiovascular: Normal rate.  Pulmonary/Chest: Effort normal.  Abdominal: Soft. There is no abdominal tenderness. There is no rebound.  Neurological: She is alert and oriented to person, place, and time.  Skin: Skin is warm and dry.  Psychiatric: Affect normal.  Nursing note and vitals reviewed.  Assessment:   1. Postpartum care and examination     Plan:   Patient given depo today FU with health department for nexplanon  Thressa Sheller DNP, CNM  04/16/18  10:23 AM

## 2019-01-01 ENCOUNTER — Other Ambulatory Visit: Payer: Self-pay

## 2019-01-01 ENCOUNTER — Encounter (HOSPITAL_COMMUNITY): Payer: Self-pay

## 2019-01-01 ENCOUNTER — Ambulatory Visit (HOSPITAL_COMMUNITY)
Admission: RE | Admit: 2019-01-01 | Discharge: 2019-01-01 | Disposition: A | Discharge status: Home / Self Care | Payer: Self-pay | Source: Ambulatory Visit | Attending: Obstetrics and Gynecology | Admitting: Obstetrics and Gynecology

## 2019-01-01 DIAGNOSIS — R87610 Atypical squamous cells of undetermined significance on cytologic smear of cervix (ASC-US): Secondary | ICD-10-CM | POA: Insufficient documentation

## 2019-01-01 DIAGNOSIS — R87810 Cervical high risk human papillomavirus (HPV) DNA test positive: Secondary | ICD-10-CM | POA: Insufficient documentation

## 2019-01-01 DIAGNOSIS — Z1239 Encounter for other screening for malignant neoplasm of breast: Secondary | ICD-10-CM

## 2019-01-01 NOTE — Progress Notes (Signed)
Patient referred to [place] by the [facility] Department due to having an abnormal Pap smear 11/28/2018 and a colposcopy is recommended for follow-up.  Pap Smear: Pap smear not completed today. Last Pap smear was 11/28/2018 at the [facility] Department and ASCUS with positive HPV. Referred [facility] to [facility] for a colposcopy to follow-up for her abnormal Pap smear. Appointment is scheduled for Monday, January 27, 2018 at [phone]. Per patient her most recent Pap smear is the only abnormal Pap smear she has had. Last Pap smear result is in Epic.  Physical exam: Breasts Breasts symmetrical. No skin abnormalities bilateral breasts. No nipple retraction bilateral breasts. No nipple discharge bilateral breasts. No lymphadenopathy. No lumps palpated bilateral breasts. No complaints of pain or tenderness on exam. Screening mammogram recommended at age 54 unless clinically indicated prior.      Pelvic/Bimanual No Pap smear completed today since last Pap smear and HPV typing was 11/28/2018. Pap smear not indicated per BCCCP guidelines.   Smoking History: Patient has never smoked.  Patient Navigation: Patient education provided. Access to services provided for patient through [facility] program. Spanish interpreter provided.   Breast and Cervical Cancer Risk Assessment: Patient has no family history of breast cancer, known genetic mutations, or radiation treatment to the chest before age 14. Patient has no history of cervical dysplasia, immunocompromised, or DES exposure in-utero. Breast cancer risk assessment completed. No breast cancer risk calculated due to patient is less than 88 years old.  Used Spanish interpreter [employer] from [place].

## 2019-01-01 NOTE — Patient Instructions (Signed)
Explained breast self awareness with Ginette Pitman. Patient did not need a Pap smear today due to last Pap smear was 11/28/2018. Explained the colposcopy the recommended follow-up for her abnormal Pap smear. Referred [facility] to the [facility] [place] for a colposcopy to follow-up for her abnormal Pap smear. Appointment is scheduled for Monday, January 27, 2018 at [phone]. Patient aware of appointment and will be there. Let patient know a screening mammogram is recommended at age 50 unless clinically indicated prior. Ginette Pitman verbalized understanding.  Santresa Levett, Arvil Chaco, RN 4:17 PM

## 2019-01-28 ENCOUNTER — Ambulatory Visit (INDEPENDENT_AMBULATORY_CARE_PROVIDER_SITE_OTHER): Payer: Self-pay | Admitting: Obstetrics & Gynecology

## 2019-01-28 ENCOUNTER — Other Ambulatory Visit: Payer: Self-pay

## 2019-01-28 ENCOUNTER — Other Ambulatory Visit (HOSPITAL_COMMUNITY)
Admission: RE | Admit: 2019-01-28 | Discharge: 2019-01-28 | Disposition: A | Discharge status: Home / Self Care | Payer: Self-pay | Source: Ambulatory Visit | Attending: Obstetrics & Gynecology | Admitting: Obstetrics & Gynecology

## 2019-01-28 ENCOUNTER — Encounter: Payer: Self-pay | Admitting: Obstetrics & Gynecology

## 2019-01-28 VITALS — BP 117/77 | HR 65 | Ht 59.0 in | Wt 112.1 lb

## 2019-01-28 DIAGNOSIS — R87810 Cervical high risk human papillomavirus (HPV) DNA test positive: Secondary | ICD-10-CM

## 2019-01-28 DIAGNOSIS — R87610 Atypical squamous cells of undetermined significance on cytologic smear of cervix (ASC-US): Secondary | ICD-10-CM

## 2019-01-28 LAB — POCT PREGNANCY, URINE: Preg Test, Ur: NEGATIVE

## 2019-01-28 NOTE — Progress Notes (Signed)
Patient given informed consent, signed copy in the chart, time out was performed.  Placed in lithotomy position. Cervix viewed with speculum and colposcope after application of acetic acid.   Colposcopy adequate?  yes Acetowhite lesions? no Punctation? no Mosaicism?  no Abnormal vasculature?  no Biopsies? yes ECC? yes  COMMENTS:  Patient was given post procedure instructions.  She will be notified in 2 weeks for results.  Scheryl Darter, MD

## 2019-01-28 NOTE — Progress Notes (Signed)
Patient ID: Beverly [name], female   DOB: 29-Oct-1985, 34 y.o.   MRN: [id number]  Chief Complaint  Patient presents with  . Procedure    HPI Beverly [name] [place] Pruden is a 34 y.o. female.  [id number] Patient's last menstrual period was 01/21/2019 (approximate).  HPI  Indications: Pap smear on November 2020 showed: ASCUS with POSITIVE high risk HPV. Previous colposcopy: none. Prior cervical treatment: no treatment.  Past Medical History:  Diagnosis Date  . Cholestasis of pregnancy     Past Surgical History:  Procedure Laterality Date  . NO PAST SURGERIES      Family History  Problem Relation Age of Onset  . Hypertension Maternal Grandmother     Social History Social History   Tobacco Use  . Smoking status: Never Smoker  . Smokeless tobacco: Never Used  Substance Use Topics  . Alcohol use: No  . Drug use: No    No Known Allergies  Current Outpatient Medications  Medication Sig Dispense Refill  . ibuprofen (ADVIL,MOTRIN) 600 MG tablet Take 1 tablet (600 mg total) by mouth every 6 (six) hours. 30 tablet 0  . hydrOXYzine (ATARAX/VISTARIL) 25 MG tablet Take 1 tablet (25 mg total) by mouth every 6 (six) hours as needed for itching. (Patient not taking: Reported on 03/07/2018) 30 tablet 2  . Prenatal Vit-Fe Fumarate-FA (PRENATAL MULTIVITAMIN) TABS tablet Take 1 tablet by mouth daily at 12 noon. (Patient not taking: Reported on 01/28/2019) 30 tablet 11  . ursodiol (ACTIGALL) 300 MG capsule Take 1 capsule (300 mg total) by mouth 3 (three) times daily. (Patient not taking: Reported on 01/28/2019) 90 capsule 0   No current facility-administered medications for this visit.    Review of Systems Review of Systems  Blood pressure 117/77, pulse 65, height 4\' 11"  (1.499 m), weight 112 lb 1.6 oz (50.8 kg), last menstrual period 01/21/2019, unknown if currently breastfeeding.  Physical Exam Physical Exam  Data Reviewed Pap result  Assessment    Procedure Details  The risks and  benefits of the procedure and Written informed consent obtained.  Speculum placed in vagina and excellent visualization of cervix achieved, cervix swabbed x 3 with acetic acid solution.  Specimens: ECC, BX 1 and 9, TZ and SCJ seen no discrete lesions  Complications: none.     Plan    Specimens labelled and sent to Pathology. Call to discuss Pathology results in 2 weeks.      01/23/2019 01/28/2019, 9:34 AM

## 2019-01-28 NOTE — Patient Instructions (Signed)
Colposcopa, cuidados posteriores Colposcopy, Care After Lea esta informacin sobre cmo cuidarse despus del procedimiento. Su mdico tambin podr darle indicaciones ms especficas. Comunquese con su mdico si tiene problemas o preguntas. Qu puedo esperar despus del procedimiento? Si se le realiz una colposcopa sin biopsia, puede esperar sentirse bien de inmediato, pero es posible que presente manchas de sangre por algunos das. Puede reanudar sus actividades habituales. Si se le realiz una colposcopa con biopsia, es frecuente que presente lo siguiente:  Sensibilidad y dolor. Esto puede durar algunos das.  Sensacin de desvanecimiento.  Sangrado leve de la vagina o secrecin granulada de color oscuro. Esto puede durar algunos das. La secrecin puede deberse a una solucin que se us durante el procedimiento. Durante este tiempo deber usar un apsito sanitario.  [place] durante al menos 48horas despus del procedimiento. Siga estas indicaciones en su casa:   Tome los medicamentos de venta libre y los recetados solamente como se lo haya indicado el mdico. Hable con el medicamento acerca de qu tipo de analgsico de venta libre y recetado puede volver a tomar. Es especialmente importante que hable con el mdico si toma medicamentos anticoagulantes.  No conduzca ni use maquinaria pesada mientras toma analgsicos recetados.  Durante al menos 3 das despus del procedimiento o durante el tiempo que le haya indicado el mdico, evite lo siguiente: ? Las duchas vaginales. ? Los tampones. ? Tener relaciones sexuales.  Contine usando un mtodo anticonceptivo (anticoncepcin).  Limite la actividad fsica durante el primer da despus del procedimiento como se lo haya indicado el mdico. Pregntele al mdico qu actividades son seguras para usted.  Es su responsabilidad retirar los resultados del procedimiento. Consulte a su mdico o en el departamento donde se realice el  procedimiento cundo estarn listos los resultados.  Concurra a todas las visitas de control como se lo haya indicado el mdico. Esto es importante. Comunquese con un mdico si:  Tiene una erupcin cutnea. Solicite ayuda de inmediato si:  Tiene una hemorragia vaginal abundante o elimina cogulos de sangre. Esto incluye usar ms de un apsito sanitario por hora durante 2 horas seguidas.  Tiene fiebre o siente escalofros.  Siente dolor plvico.  Tiene secrecin vaginal anormal, color amarillenta o con mal olor. Puede ser un signo de infeccin.  Tiene dolor intenso o clicos en la parte baja del abdomen que no se alivian con medicamentos.  Tiene vahdos, se siente mareada o se desmaya. Resumen  Si se le realiz una colposcopa sin biopsia, puede esperar sentirse bien de inmediato, pero es posible que presente manchas de sangre por algunos das. Puede reanudar sus actividades habituales.  Si se le realiz una colposcopa con biopsia, puede notar un dolor leve y manchas de sangre durante 48 horas despus del procedimiento.  Evite usar duchas vaginales, usar tampones y mantener relaciones sexuales durante 3 das luego del procedimiento o durante el tiempo que le haya indicado el mdico.  Comunquese con el mdico si tiene hemorragia, dolor intenso o signos de infeccin. Esta informacin no tiene como fin reemplazar el consejo del mdico. Asegrese de hacerle al mdico cualquier pregunta que tenga. Document Revised: 11/30/2016 Document Reviewed: 08/09/2012 Elsevier Patient Education  2020 Elsevier Inc.  

## 2019-01-30 LAB — SURGICAL PATHOLOGY

## 2019-02-07 ENCOUNTER — Telehealth (INDEPENDENT_AMBULATORY_CARE_PROVIDER_SITE_OTHER): Payer: Self-pay | Admitting: Lactation Services

## 2019-02-07 DIAGNOSIS — R87810 Cervical high risk human papillomavirus (HPV) DNA test positive: Secondary | ICD-10-CM

## 2019-02-07 DIAGNOSIS — R87610 Atypical squamous cells of undetermined significance on cytologic smear of cervix (ASC-US): Secondary | ICD-10-CM

## 2019-02-07 NOTE — Telephone Encounter (Signed)
Pt called in and was informed of her Pap Smear results and the need for follow up Pap in 1 year. Spoke with pt with assistance of in house Research officer, trade union. Pt voiced understanding.

## 2019-02-07 NOTE — Telephone Encounter (Signed)
Called pt with assistance from [facility] [employer] # [phone]. Female answered the phone and reports pt is working currently and can call her around 11-12. Female reports phone number to reach pt is [phone].

## 2019-02-07 NOTE — Telephone Encounter (Signed)
Called pt's mobile number with interpreter Raquel. VM left stating I was calling results and requested that pt call the office.

## 2019-02-07 NOTE — Telephone Encounter (Signed)
-----   Message from Adam Phenix, MD sent at 02/06/2019  4:05 PM EST ----- CIN 1, repeat pap in 12 months

## 2019-02-19 ENCOUNTER — Encounter: Payer: Self-pay | Admitting: General Practice

## 2019-05-22 ENCOUNTER — Encounter: Payer: Self-pay | Admitting: Advanced Practice Midwife

## 2019-05-22 ENCOUNTER — Ambulatory Visit: Payer: Self-pay | Admitting: Advanced Practice Midwife

## 2019-09-19 ENCOUNTER — Ambulatory Visit: Payer: Self-pay | Attending: Internal Medicine

## 2019-09-19 DIAGNOSIS — Z23 Encounter for immunization: Secondary | ICD-10-CM

## 2019-09-19 NOTE — Progress Notes (Signed)
   Covid-19 Vaccination Clinic  Name:  Beverly [name]    MRN: [id number] DOB: [birth year]  09/19/2019  Ms. Beverly [name] was observed post Covid-19 immunization for 15 minutes without incident. She was provided with Vaccine Information Sheet and instruction to access the V-Safe system.   Ms. Beverly [name] was instructed to call 911 with any severe reactions post vaccine: Marland Kitchen Difficulty breathing  . Swelling of face and throat  . A fast heartbeat  . A bad rash all over body  . Dizziness and weakness   Immunizations Administered    Name Date Dose VIS Date Route   Pfizer COVID-19 Vaccine 09/19/2019 11:23 AM 0.3 mL 03/20/2018 Intramuscular   Manufacturer: [employer], [employer]   Lot: [id number]   NDC: [address]-2

## 2019-10-10 ENCOUNTER — Ambulatory Visit: Payer: Self-pay | Attending: Internal Medicine

## 2019-10-10 DIAGNOSIS — Z23 Encounter for immunization: Secondary | ICD-10-CM

## 2019-10-10 NOTE — Progress Notes (Signed)
   Covid-19 Vaccination Clinic  Name:  Beverly [name]    MRN: [id number] DOB: February 17, 1985  10/10/2019  Ms. Beverly [name] was observed post Covid-19 immunization for 15 minutes without incident. She was provided with Vaccine Information Sheet and instruction to access the V-Safe system.   Ms. Beverly [name] was instructed to call 911 with any severe reactions post vaccine: Marland Kitchen Difficulty breathing  . Swelling of face and throat  . A fast heartbeat  . A bad rash all over body  . Dizziness and weakness   Immunizations Administered    Name Date Dose VIS Date Route   Pfizer COVID-19 Vaccine 10/10/2019  9:33 AM 0.3 mL 03/20/2018 Intramuscular   Manufacturer: [employer], [employer]   Lot: [id number]   NDC: [id number]

## 2020-01-23 IMAGING — US US FETAL BPP W/ NON-STRESS
1 series · 13 of 14 positions shown · non-contrast
Comparison: none

[Series 1: us fetal bpp w/nonstress · 14 acquisitions, 13 frames shown]
[im 1/14]
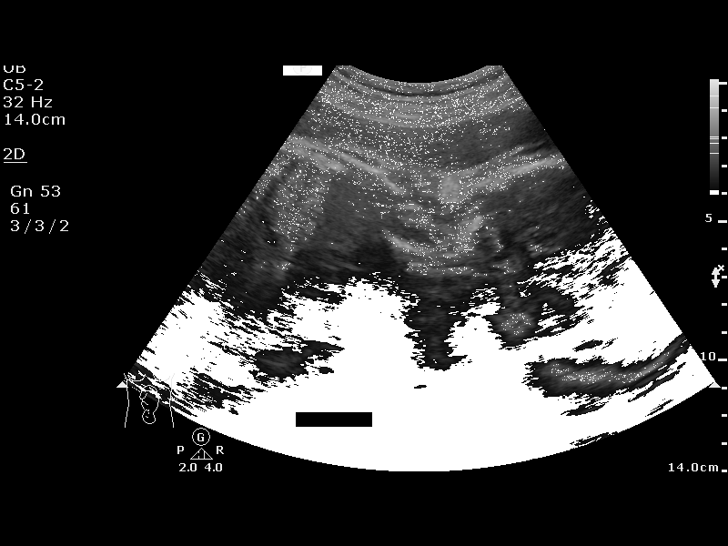
[im 2/14]
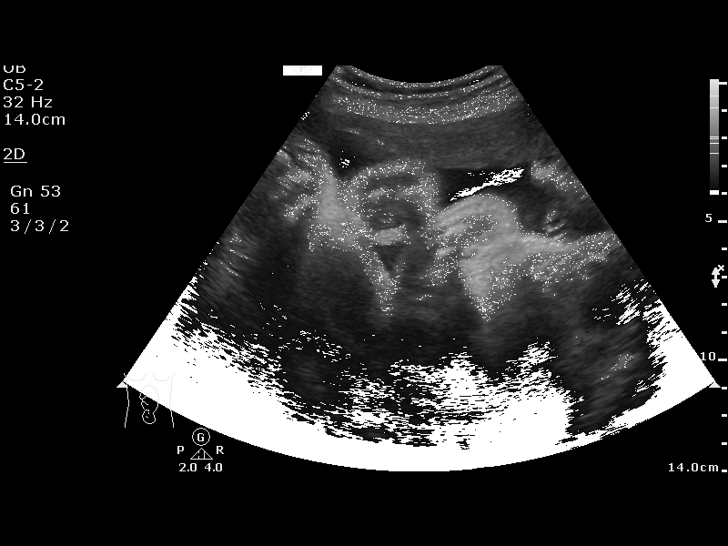
[im 3/14]
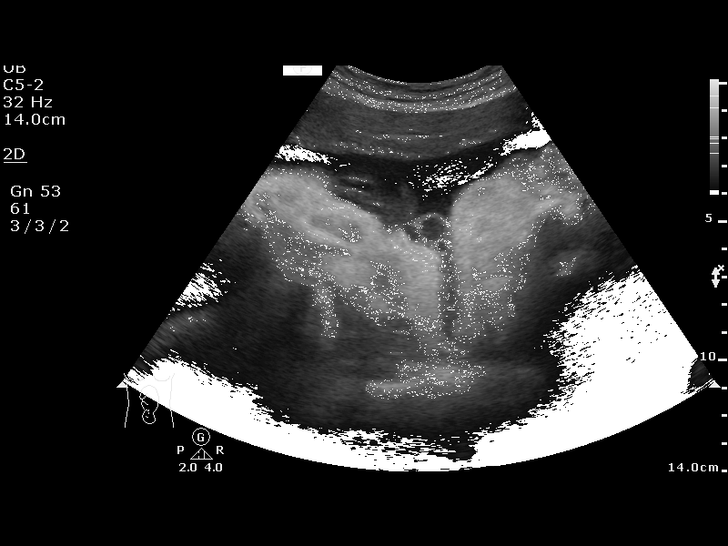
[im 4/14]
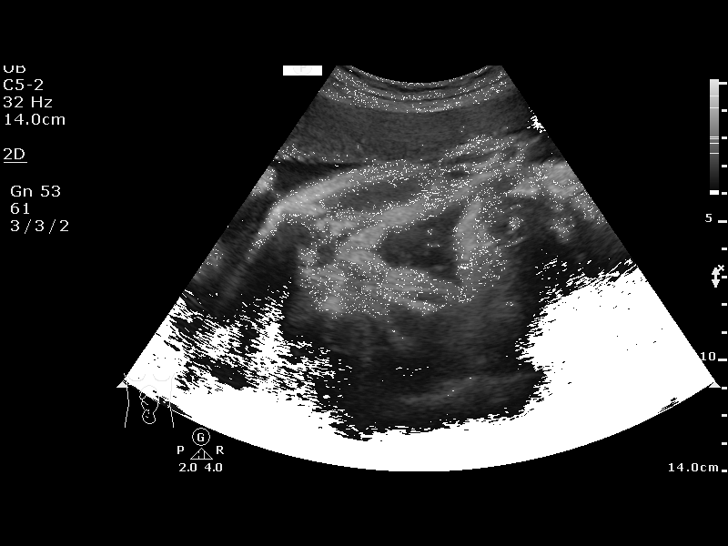
[im 5/14]
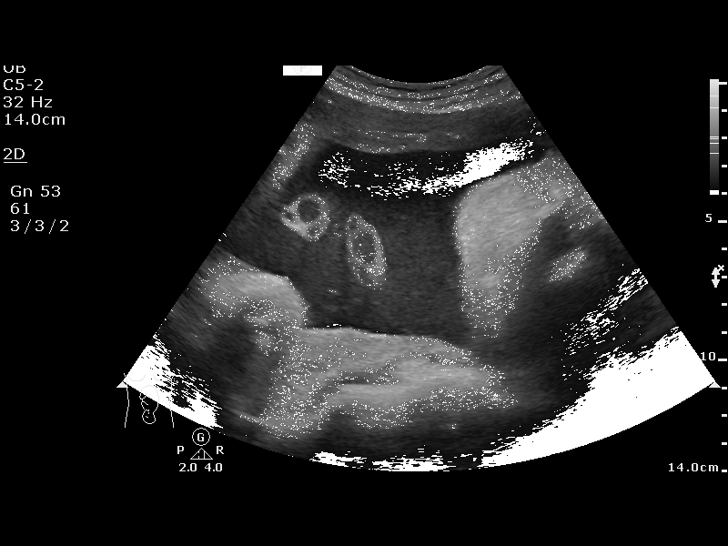
[im 6/14]
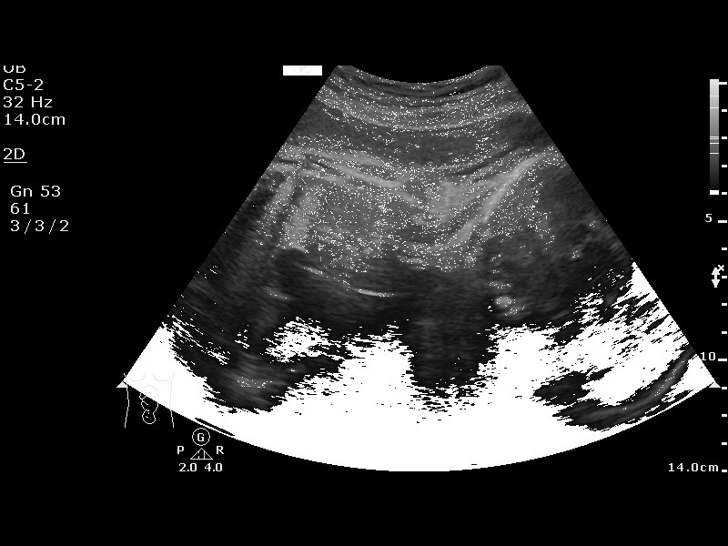
[im 8/14]
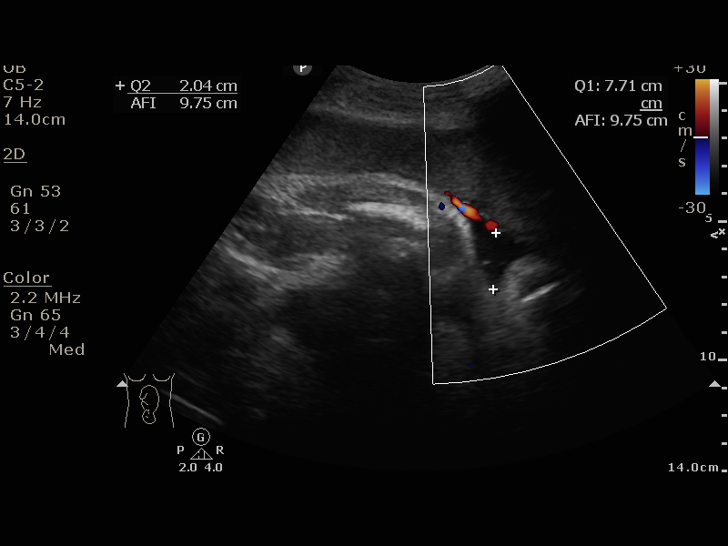
[im 9/14]
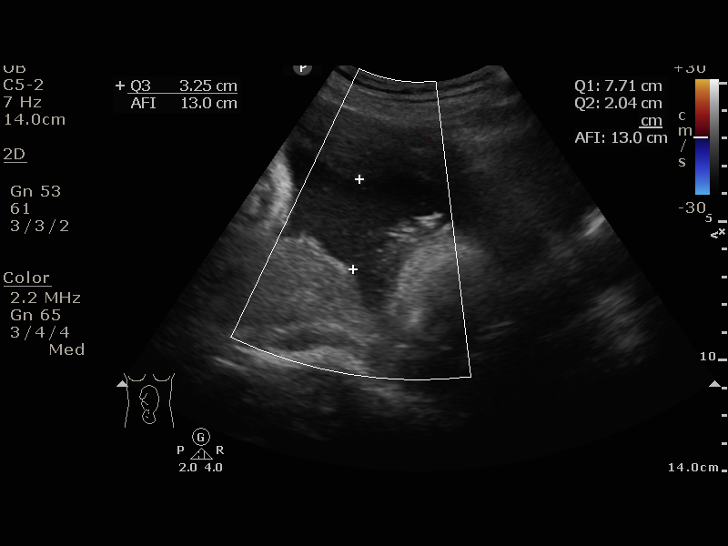
[im 10/14]
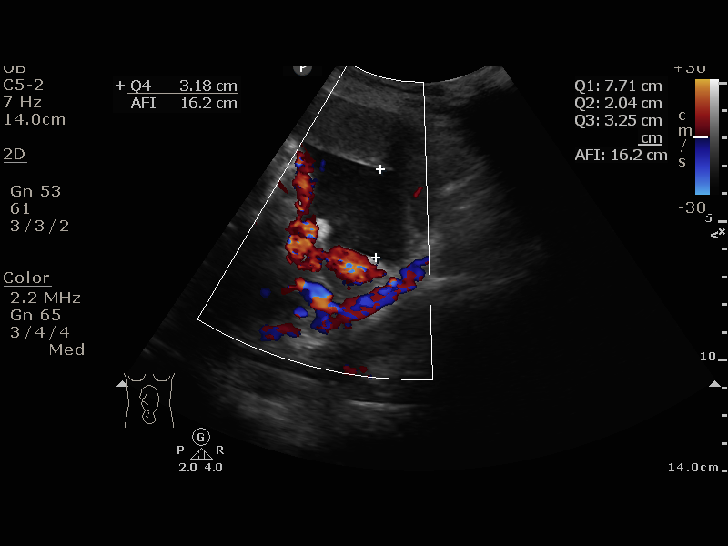
[im 11/14]
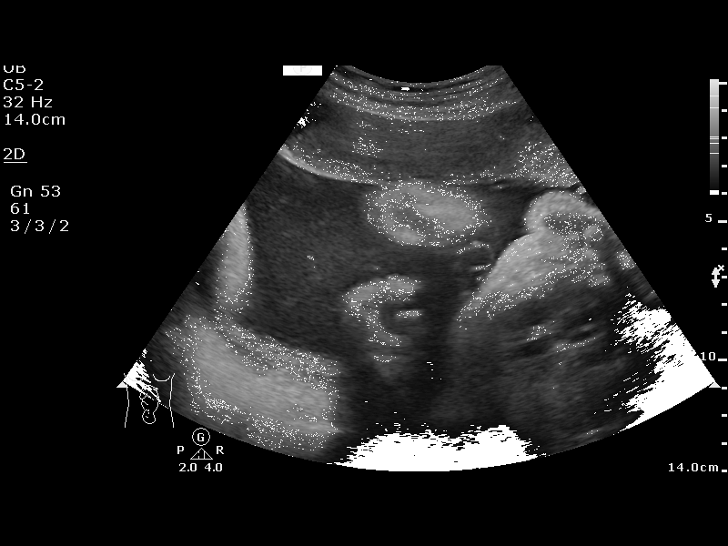
[im 12/14]
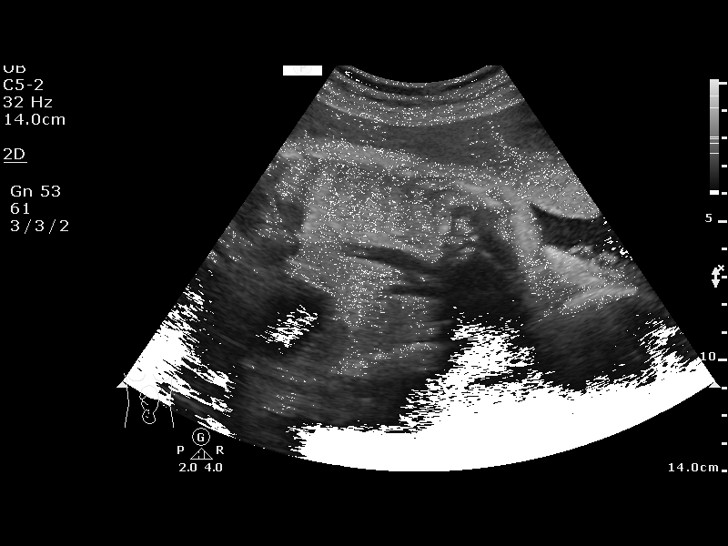
[im 13/14]
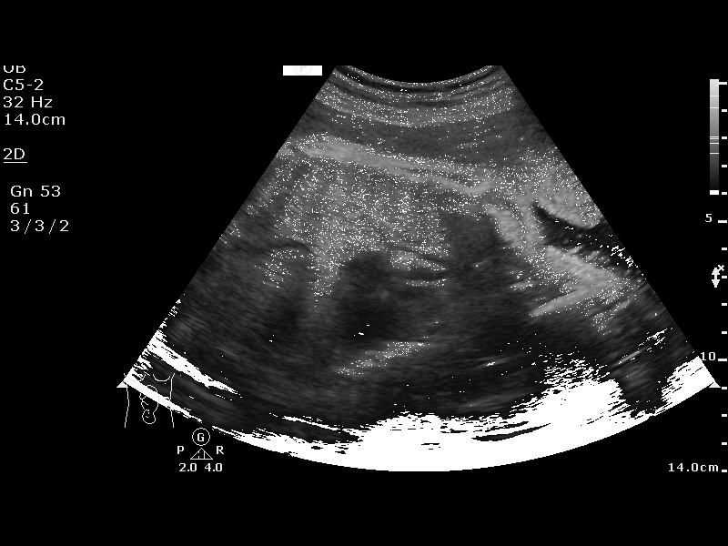
[im 14/14]
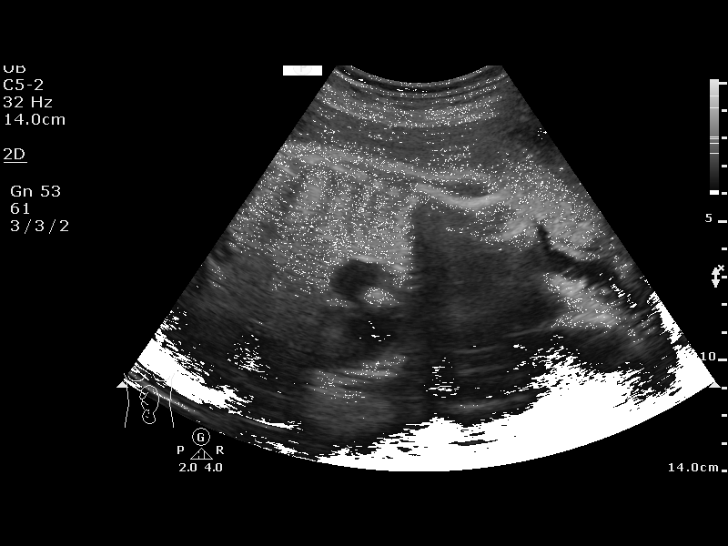

[13 of 14 positions shown; findings below may reference images not displayed]

DOM

                                                            OB/Gyn Clinic
                                                            [REDACTED]care

  1  US FETAL BPP W/NONSTRESS             76818.4      AMNON TIGER
 ----------------------------------------------------------------------

 ----------------------------------------------------------------------
Service(s) Provided

 ----------------------------------------------------------------------
Indications

  34 weeks gestation of pregnancy
  Cholestasis of pregnancy, third trimester      [id number]
 ----------------------------------------------------------------------
Vital Signs

                                                Height:        5'2"
Fetal Evaluation

 Num Of Fetuses:         1
 Preg. Location:         Intrauterine
 Cardiac Activity:       Observed
 Presentation:           Cephalic

 Amniotic Fluid
 AFI FV:      Subjectively upper-normal

 AFI Sum(cm)     %Tile       Largest Pocket(cm)
 16.18           59
 RUQ(cm)       RLQ(cm)       LUQ(cm)        LLQ(cm)

Biophysical Evaluation

 Amniotic F.V:   Pocket => 2 cm two         F. Tone:        Observed
                 planes
 F. Movement:    Observed                   N.S.T:          Reactive
 F. Breathing:   Observed                   Score:          [DATE]
Gestational Age

 LMP:           36w 1d        Date:  06/20/17                 EDD:   03/27/18
 Best:          34w 5d     Det. By:  Previous Ultrasound      EDD:   04/06/18
                                     (10/23/17)
Impression

 BPP [DATE]
Recommendations

 Continue weekly antenatal testing till delivery.
                  Talaie, La Tuan

## 2020-01-30 IMAGING — US US MFM FETAL BPP W/O NON-STRESS
1 series · 13 of 28 positions shown · non-contrast
Comparison: none

[Series 1: us mfm fetal bpp w/o non-stress · 37 acquisitions, 13 frames shown]
[im 2/37]
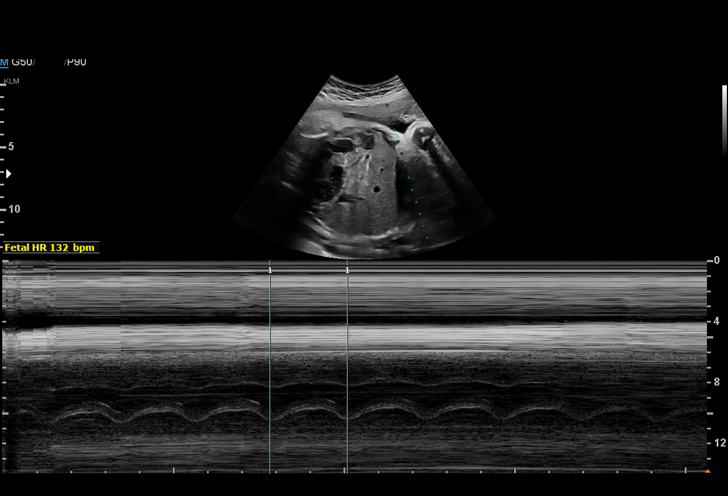
[im 5/37]
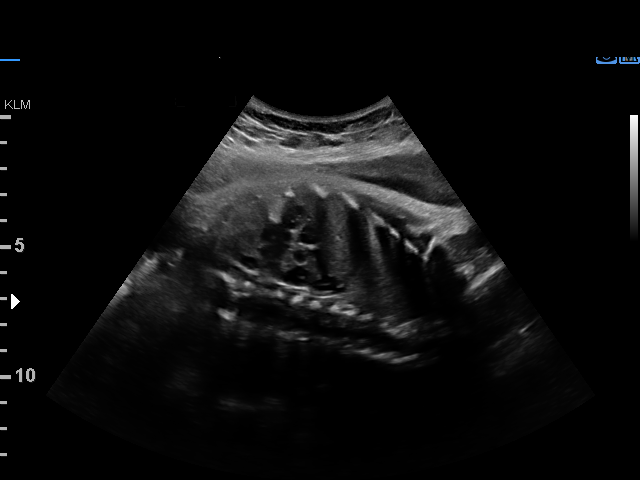
[im 7/37]
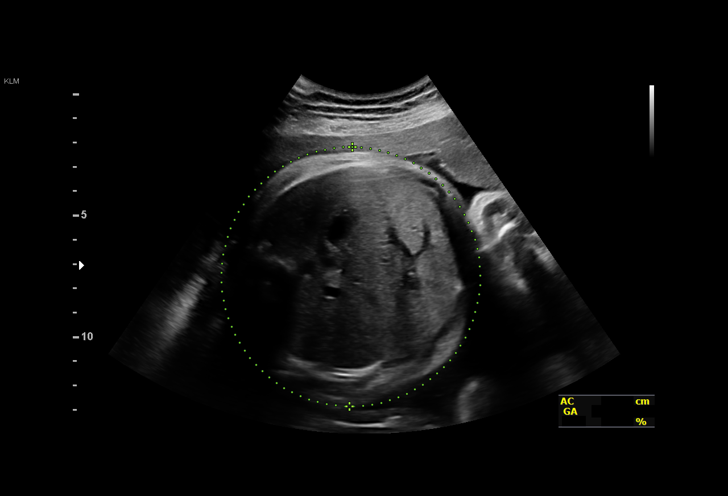
[im 10/37]
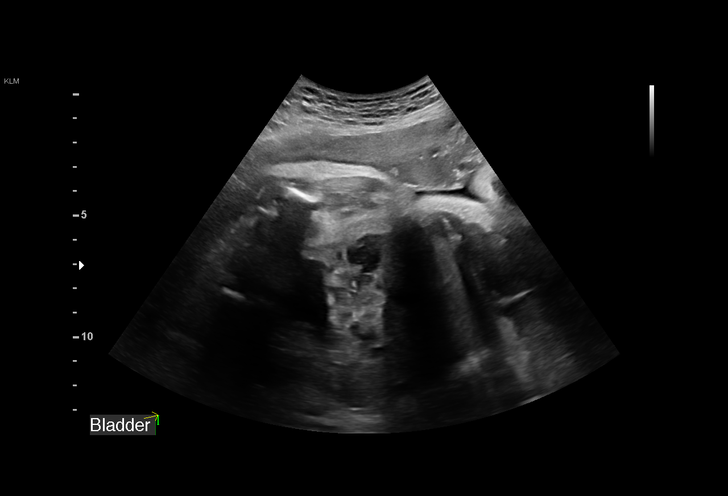
[im 13/37]
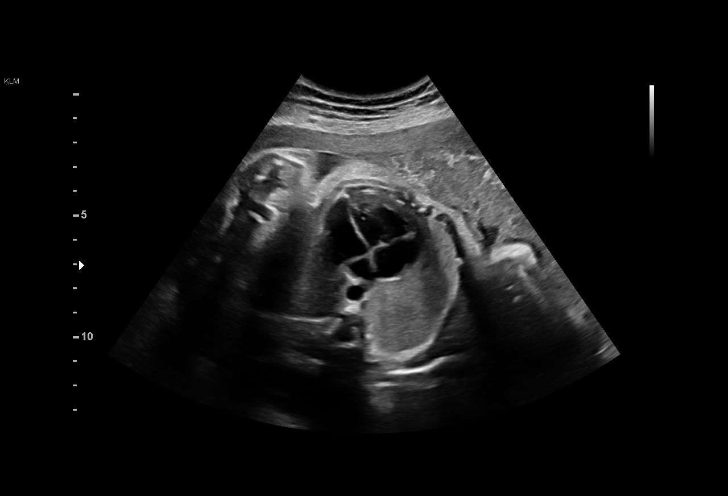
[im 15/37]
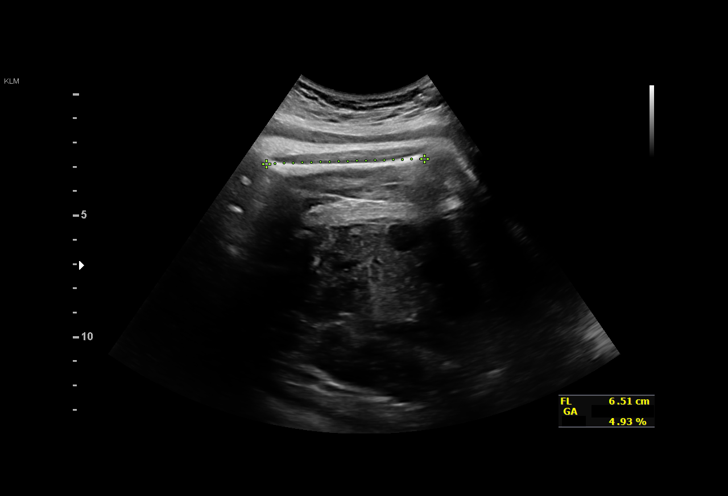
[im 19/37]
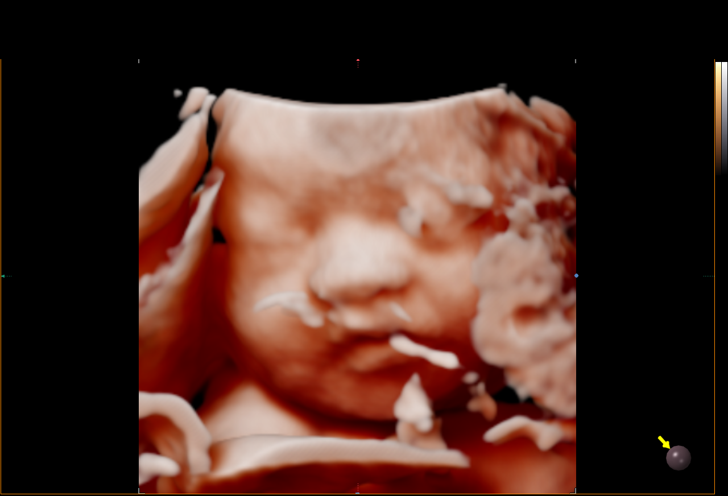
[im 22/37]
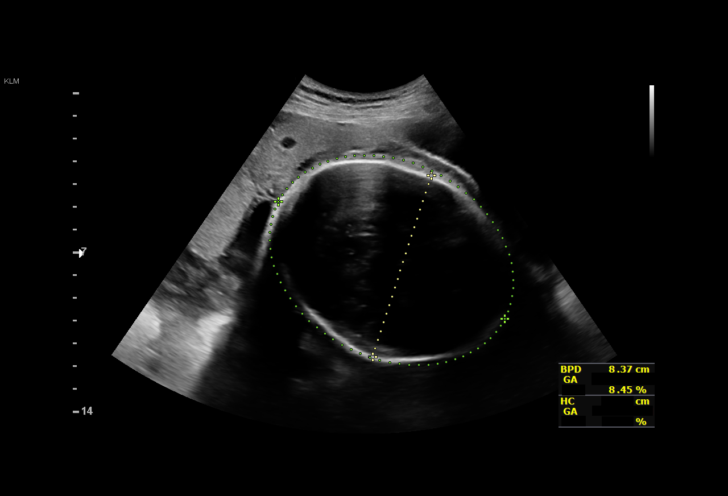
[im 25/37]
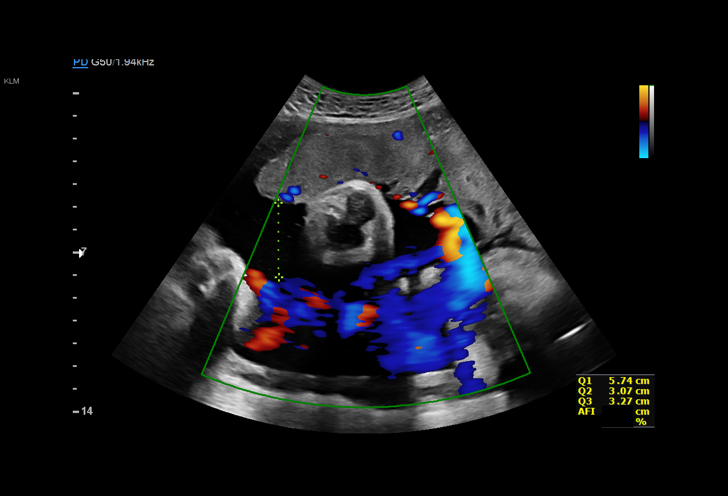
[im 27/37]
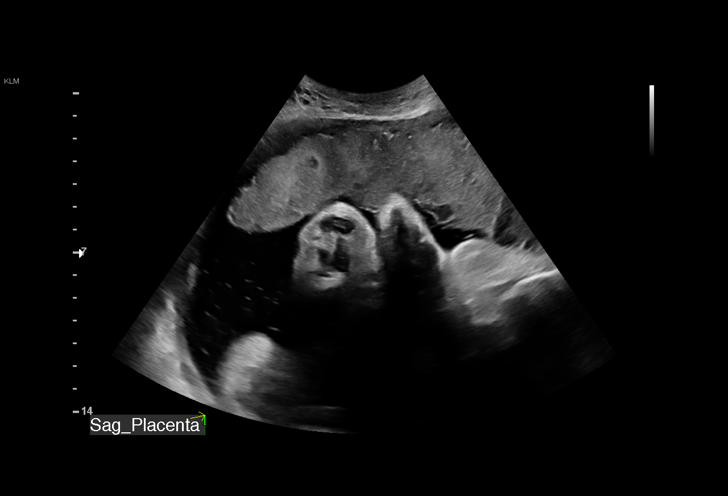
[im 30/37]
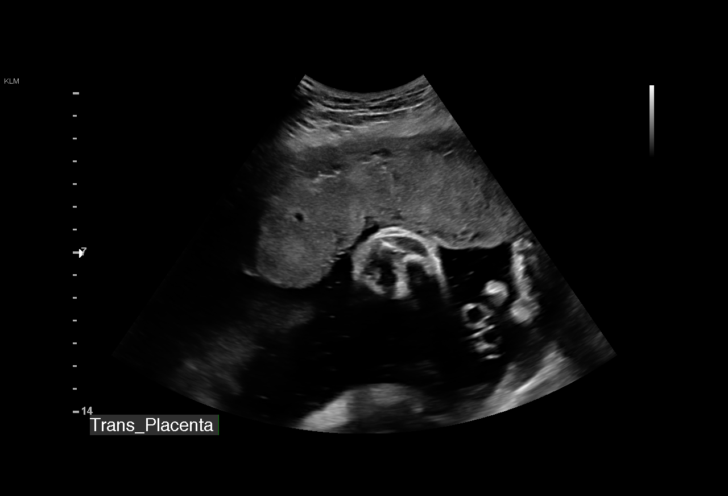
[im 33/37]
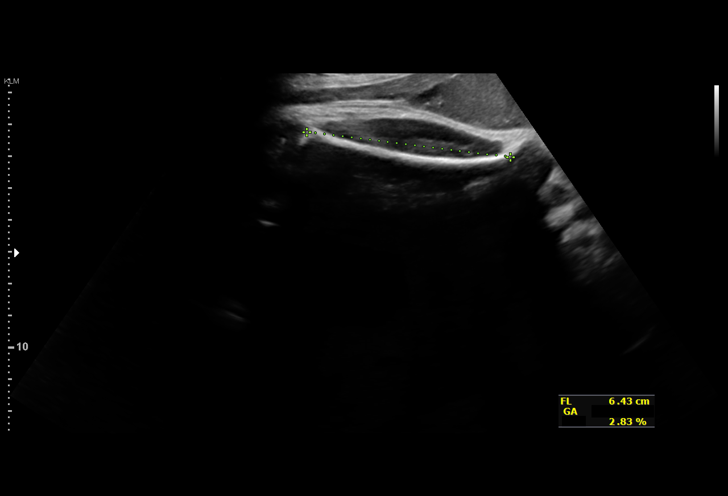
[im 35/37]
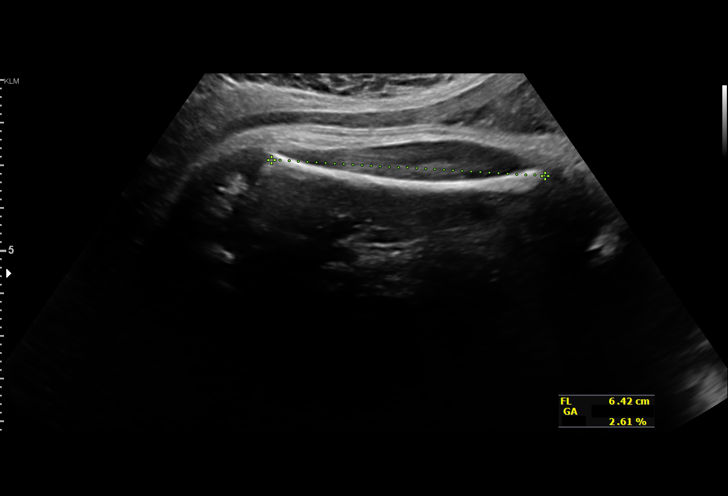

[13 of 28 positions shown; findings below may reference images not displayed]

INDI

                                                            OB/Gyn Clinic

     STRESS                                            SANGBEOM
                                                       SANGBEOM
 ----------------------------------------------------------------------

 ----------------------------------------------------------------------
Indications

  Cholestasis of pregnancy, third trimester      [id number]
  35 weeks gestation of pregnancy
  Poor obstetric history: Previous fetal growth
  restriction (FGR) (2265g @38w delivery)
 ----------------------------------------------------------------------
Vital Signs

                                                Height:        5'2"
Fetal Evaluation

 Num Of Fetuses:         1
 Fetal Heart Rate(bpm):  132
 Cardiac Activity:       Observed
 Presentation:           Cephalic
 Placenta:               Anterior
 P. Cord Insertion:      Visualized, central

 Amniotic Fluid
 AFI FV:      Within normal limits

 AFI Sum(cm)     %Tile       Largest Pocket(cm)
 16.53           61
 RUQ(cm)       RLQ(cm)       LUQ(cm)        LLQ(cm)

Biophysical Evaluation

 Amniotic F.V:   Within normal limits       F. Tone:        Observed
 F. Movement:    Observed                   Score:          [DATE]
 F. Breathing:   Observed
Biometry

 BPD:      80.2  mm     G. Age:  32w 1d        < 1  %    CI:        67.81   %    70 - 86
                                                         FL/HC:      21.1   %    20.1 -
 HC:      311.6  mm     G. Age:  34w 6d          8  %    HC/AC:      0.96        0.93 -
 AC:      325.4  mm     G. Age:  36w 3d         78  %    FL/BPD:     81.9   %    71 - 87
 FL:       65.7  mm     G. Age:  33w 6d          8  %    FL/AC:      20.2   %    20 - 24

 Est. FW:    8142  gm    5 lb 12 oz      51  %
Gestational Age

 LMP:           37w 1d        Date:  06/20/17                 EDD:   03/27/18
 U/S Today:     34w 2d                                        EDD:   04/16/18
 Best:          35w 5d     Det. By:  Previous Ultrasound      EDD:   04/06/18
                                     (10/23/17)
Anatomy

 Cranium:               Appears normal         Aortic Arch:            Previously seen
 Cavum:                 Previously seen        Ductal Arch:            Previously seen
 Ventricles:            Previously seen        Diaphragm:              Previously seen
 Choroid Plexus:        Previously seen        Stomach:                Appears normal, left
                                                                       sided
 Cerebellum:            Previously seen        Abdomen:                Appears normal
 Posterior Fossa:       Previously seen        Abdominal Wall:         Previously seen
 Nuchal Fold:           Not applicable (>20    Cord Vessels:           Previously seen
                        wks GA)
 Face:                  Orbits previously      Kidneys:                Appear normal
                        seen
 Lips:                  Previously seen        Bladder:                Appears normal
 Thoracic:              Appears normal         Spine:                  Previously seen
 Heart:                 Appears normal         Upper Extremities:      Previously seen
                        (4CH, axis, and situs
 RVOT:                  Appears normal         Lower Extremities:      Previously seen
 LVOT:                  Appears normal
Cervix Uterus Adnexa

 Cervix
 Not visualized (advanced GA >19wks)
Impression

 Cholestasis of pregnancy. Patient actigall 300 mg tid and
 reports relief in her symptom (itching). Repeat total bile acid
 level is 51.3 umol/L (previously 94.2 umol/L 3 weeks ago).

 Patient had reactive NST at your office today.

 She has been scheduled for induction of labor to be
 performed on 03/16/2018.

 On ultrasound, amniotic fluid is normal and good fetal activity
 is seen. Cephalic presentation. Fetal growth is appropriate for
 gestational age. Antenatal testing is reassuring. BPP [DATE].
 I counseled the patient with help of interpreter.

 Timing of delivery: We recommend delivery at 37 weeks.
 However, it is not based on clear evidence. ACOG
 recommends delivery at 36-37 weeks (SMFM 37-38 weeks).
 Earlier delivery (36 weeks) has been considered if bile acid
 levels are high and the patient continues to be symptomatic.

 Our patient has marked improvement in her symptoms and
 antenatal testing is  reassuring.
Recommendations

 -I recommended antenatal testing in 4 days (03/12/18).
 -Induction of labor at 37 weeks as planned.
                 Tiger, Ebadat

## 2020-02-24 ENCOUNTER — Emergency Department (HOSPITAL_COMMUNITY)
Admission: EM | Admit: 2020-02-24 | Discharge: 2020-02-24 | Disposition: A | Discharge status: Home / Self Care | Payer: Self-pay | Attending: Emergency Medicine | Admitting: Emergency Medicine

## 2020-02-24 ENCOUNTER — Encounter (HOSPITAL_COMMUNITY): Payer: Self-pay | Admitting: Emergency Medicine

## 2020-02-24 ENCOUNTER — Other Ambulatory Visit: Payer: Self-pay

## 2020-02-24 DIAGNOSIS — H1031 Unspecified acute conjunctivitis, right eye: Secondary | ICD-10-CM | POA: Insufficient documentation

## 2020-02-24 DIAGNOSIS — H0012 Chalazion right lower eyelid: Secondary | ICD-10-CM | POA: Insufficient documentation

## 2020-02-24 MED ORDER — TETRACAINE HCL 0.5 % OP SOLN
1.0000 [drp] | Freq: Once | OPHTHALMIC | Status: AC
  Administered 2020-02-24: 1 [drp] via OPHTHALMIC
  Filled 2020-02-24: qty 4

## 2020-02-24 MED ORDER — ERYTHROMYCIN 5 MG/GM OP OINT: 1.0000 "application " | TOPICAL_OINTMENT | Freq: Four times a day (QID) | OPHTHALMIC | 1 refills | Status: AC

## 2020-02-24 MED ORDER — FLUORESCEIN SODIUM 1 MG OP STRP
1.0000 | ORAL_STRIP | Freq: Once | OPHTHALMIC | Status: AC
  Administered 2020-02-24: 1 via OPHTHALMIC
  Filled 2020-02-24: qty 1

## 2020-02-24 NOTE — ED Provider Notes (Signed)
[facility] EMERGENCY DEPARTMENT Provider Note   CSN: [id number] Arrival date & time: 02/24/20  1252     History No chief complaint on file.   Beverly [name] is a 35 y.o. female.  35 year old female presents to the emergency department for evaluation of eye pain and redness.  Symptom onset on Friday evening.  States that she noticed an area of swelling to her lower eyelid which drained the following day.  This caused increased pain and redness to her eye.  Endorsing some sporadic blurriness in her right eye, likely attributed to eye tearing as well as crusting on her lashes.  This made it difficult for her to open her eye this morning.  Denies any eye trauma as well as the use of contact lenses, no associated fever. No medications taken PTA.  The history is provided by the patient. No language interpreter was used.       Past Medical History:  Diagnosis Date  . Cholestasis of pregnancy     Patient Active Problem List   Diagnosis Date Noted  . Screening breast examination 01/01/2019  . Atypical squamous cell changes of undetermined significance (ASCUS) on cervical cytology with positive high risk human papilloma virus (HPV) 01/01/2019  . History of low birth weight 10/16/2017  . Language barrier 10/11/2017    Past Surgical History:  Procedure Laterality Date  . NO PAST SURGERIES       OB History    Gravida  2   Para  2   Term  2   Preterm      AB      Living  2     SAB      IAB      Ectopic      Multiple  0   Live Births  2           Family History  Problem Relation Age of Onset  . Hypertension Maternal Grandmother     Social History   Tobacco Use  . Smoking status: Never Smoker  . Smokeless tobacco: Never Used  Vaping Use  . Vaping Use: Never used  Substance Use Topics  . Alcohol use: No  . Drug use: No    Home Medications Prior to Admission medications   Medication Sig Start Date End Date Taking?  Authorizing Provider  erythromycin ophthalmic ointment Place 1 application into the right eye every 6 (six) hours. Place 1/2 inch ribbon of ointment in the affected eye 4 times a day for 1 week. 02/24/20  Yes Antony Madura, PA-C  hydrOXYzine (ATARAX/VISTARIL) 25 MG tablet Take 1 tablet (25 mg total) by mouth every 6 (six) hours as needed for itching. Patient not taking: Reported on 03/07/2018 02/14/18   Tereso Newcomer, MD  ibuprofen (ADVIL,MOTRIN) 600 MG tablet Take 1 tablet (600 mg total) by mouth every 6 (six) hours. 03/18/18   Aviva Signs, CNM  Prenatal Vit-Fe Fumarate-FA (PRENATAL MULTIVITAMIN) TABS tablet Take 1 tablet by mouth daily at 12 noon. Patient not taking: Reported on 01/28/2019 11/22/17   Conan Bowens, MD  ursodiol (ACTIGALL) 300 MG capsule Take 1 capsule (300 mg total) by mouth 3 (three) times daily. Patient not taking: Reported on 01/28/2019 02/14/18   Tereso Newcomer, MD    Allergies    Patient has no known allergies.  Review of Systems   Review of Systems  Ten systems reviewed and are negative for acute change, except as noted in the HPI.  Physical Exam Updated Vital Signs BP 129/82   Pulse 70   Temp 98.4 F (36.9 C) (Oral)   Resp 16   SpO2 99%   Physical Exam Vitals and nursing note reviewed.  Constitutional:      General: She is not in acute distress.    Appearance: She is well-developed and well-nourished. She is not diaphoretic.     Comments: Nontoxic appearing and in NAD  HENT:     Head: Normocephalic and atraumatic.  Eyes:     General: No scleral icterus.    Extraocular Movements: EOM normal.     Conjunctiva/sclera: Conjunctivae normal.     Comments: Preserved EOMs. No proptosis or hyphema.  There is minimal blepharitis noted to the right eye.  Conjunctival injection is present.  No active drainage or tearing.  Evidence of chalazion to the lower outer lid.  No periorbital edema or erythema.  Scant telangiectasias noted infraorbitally extending  along the zygomatic arch.  No uptake on fluorescein staining; no corneal abrasion or ulcer of R eye.  No dendritic staining.  Negative Seidel's sign.  Intraocular pressure 20 in the right eye with 95% CI.  Pulmonary:     Effort: Pulmonary effort is normal. No respiratory distress.  Musculoskeletal:        General: Normal range of motion.     Cervical back: Normal range of motion.  Skin:    General: Skin is warm and dry.     Coloration: Skin is not pale.     Findings: No erythema or rash.  Neurological:     Mental Status: She is alert and oriented to person, place, and time.  Psychiatric:        Mood and Affect: Mood and affect normal.        Behavior: Behavior normal.     ED Results / Procedures / Treatments   Labs (all labs ordered are listed, but only abnormal results are displayed) Labs Reviewed - No data to display  EKG None  Radiology No results found.  Procedures Procedures   Medications Ordered in ED Medications  tetracaine (PONTOCAINE) 0.5 % ophthalmic solution 1 drop (has no administration in time range)  fluorescein ophthalmic strip 1 strip (has no administration in time range)    ED Course  I have reviewed the triage vital signs and the nursing notes.  Pertinent labs & imaging results that were available during my care of the patient were reviewed by me and considered in my medical decision making (see chart for details).    MDM Rules/Calculators/A&P                          35 year old with right eye pain and redness suspected to be associated with a right lower lid chalazion.  She does not wear corrective lenses or contacts.  No hx of trauma.  EOMs intact.  No present concern for preseptal or orbital cellulitis.  Work-up negative for acute glaucoma.  She has been placed on erythromycin ointment and given instruction for compresses.  Referral provided to ophthalmology to ensure symptom resolution.  Return precautions discussed and provided.  Patient  discharged in stable condition with no unaddressed concerns.   Final Clinical Impression(s) / ED Diagnoses Final diagnoses:  Acute conjunctivitis of right eye, unspecified acute conjunctivitis type  Chalazion of right lower eyelid    Rx / DC Orders ED Discharge Orders         Ordered    erythromycin ophthalmic  ointment  Every 6 hours        02/24/20 1537           Antony Madura, [place] 02/24/20 1542    Pollyann Savoy, MD 02/24/20 [phone]

## 2020-02-24 NOTE — ED Triage Notes (Signed)
Pt complains of redness around right eye. Eyelid and skin under eye are red. Denies blurry vision. Pt states this all started Saturday and she had a small "lump" under her eye that burst. Right Eye felt very painful.

## 2020-02-24 NOTE — ED Notes (Signed)
DC instructions reviewed with pt via interpreter. Pt verbalized understanding.  Pt DC.

## 2020-02-24 NOTE — Discharge Instructions (Signed)
Use the erythromycin ointment as prescribed and follow-up with an eye specialist to ensure that symptoms resolve.  You may return for new or concerning symptoms or if swelling around your eye worsens or you develop a fever.
